# Patient Record
Sex: Male | Born: 1955 | Race: Black or African American | Hispanic: No | Marital: Married | Smoking: Never smoker
Health system: Southern US, Community
[De-identification: ages and names within clinical notes are randomized; demographics above are authoritative.]

## PROBLEM LIST (undated history)

## (undated) DIAGNOSIS — I10 Essential (primary) hypertension: Secondary | ICD-10-CM

## (undated) DIAGNOSIS — N200 Calculus of kidney: Secondary | ICD-10-CM

## (undated) DIAGNOSIS — T8859XA Other complications of anesthesia, initial encounter: Secondary | ICD-10-CM

## (undated) HISTORY — PX: KNEE ARTHROSCOPY: SUR90

## (undated) HISTORY — PX: OTHER SURGICAL HISTORY: SHX169

---

## 2001-06-21 ENCOUNTER — Encounter: Payer: Self-pay | Admitting: Emergency Medicine

## 2001-06-21 ENCOUNTER — Emergency Department (HOSPITAL_COMMUNITY): Admission: EM | Admit: 2001-06-21 | Discharge: 2001-06-21 | Payer: Self-pay | Admitting: Emergency Medicine

## 2001-07-12 ENCOUNTER — Emergency Department (HOSPITAL_COMMUNITY): Admission: EM | Admit: 2001-07-12 | Discharge: 2001-07-12 | Payer: Self-pay | Admitting: Emergency Medicine

## 2001-07-12 ENCOUNTER — Encounter: Payer: Self-pay | Admitting: Emergency Medicine

## 2001-10-31 ENCOUNTER — Emergency Department (HOSPITAL_COMMUNITY): Admission: EM | Admit: 2001-10-31 | Discharge: 2001-10-31 | Payer: Self-pay | Admitting: Internal Medicine

## 2001-10-31 ENCOUNTER — Encounter: Payer: Self-pay | Admitting: Internal Medicine

## 2002-07-12 ENCOUNTER — Encounter: Payer: Self-pay | Admitting: Emergency Medicine

## 2002-07-12 ENCOUNTER — Emergency Department (HOSPITAL_COMMUNITY): Admission: EM | Admit: 2002-07-12 | Discharge: 2002-07-12 | Payer: Self-pay | Admitting: Emergency Medicine

## 2003-10-23 ENCOUNTER — Ambulatory Visit (HOSPITAL_COMMUNITY): Admission: RE | Admit: 2003-10-23 | Discharge: 2003-10-23 | Payer: Self-pay | Admitting: Family Medicine

## 2006-04-28 ENCOUNTER — Inpatient Hospital Stay (HOSPITAL_COMMUNITY): Admission: EM | Admit: 2006-04-28 | Discharge: 2006-05-09 | Payer: Self-pay | Admitting: Emergency Medicine

## 2006-09-15 ENCOUNTER — Emergency Department (HOSPITAL_COMMUNITY): Admission: EM | Admit: 2006-09-15 | Discharge: 2006-09-16 | Payer: Self-pay | Admitting: Emergency Medicine

## 2006-09-17 ENCOUNTER — Ambulatory Visit (HOSPITAL_COMMUNITY): Admission: RE | Admit: 2006-09-17 | Discharge: 2006-09-17 | Payer: Self-pay | Admitting: Internal Medicine

## 2006-10-07 ENCOUNTER — Ambulatory Visit (HOSPITAL_COMMUNITY): Admission: RE | Admit: 2006-10-07 | Discharge: 2006-10-07 | Payer: Self-pay | Admitting: Pulmonary Disease

## 2007-08-08 IMAGING — NM NM PULM PERFUSION & VENT (REBREATHING & WASHOUT)
10 series · 20 of 20 positions shown · non-contrast
Comparison: Chest x-ray 05/04/2006

CLINICAL DATA: Pneumonia, DVT, assess for pulmonary embolus.

NM VENTILATION - PERFUSION LUNG SCAN
TECHNIQUE: Wash-in, equilibrium, and wash-out phase ventilation images were
obtained using 0e-922 gas.  Perfusion images were obtained in multiple
projections after intravenous injection of Oc-CCm MAA.
Radiopharmaceutical:  20 mCi 0e-922 gas and 5.5 mCi Oc-CCm MAA

[Series 1: lung scan · 6.39mm/px · 6 of 16 frames shown (1 of 10)]
[frame 2/16]
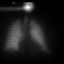
[frame 4/16]
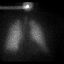
[frame 7/16]
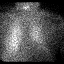
[frame 10/16]
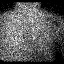
[frame 12/16]
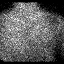
[frame 15/16]
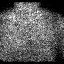

[Series 1: lung scan · 1.60mm/px · 1 of 1 slices shown (2 of 10)]
[im 1/1]
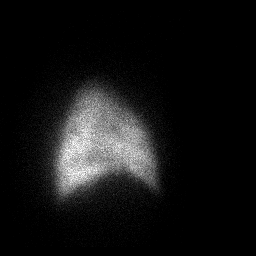

[Series 1: lung scan · 1.60mm/px · 1 of 1 slices shown (3 of 10)]
[im 1/1]
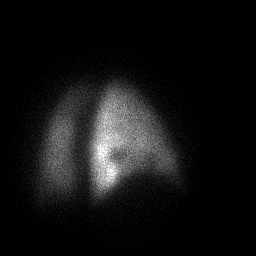

[Series 1: lung scan · 1.60mm/px · 1 of 1 slices shown (4 of 10)]
[im 1/1]
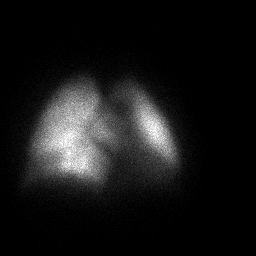

[Series 1: lung scan · 1.60mm/px · 1 of 1 slices shown (5 of 10)]
[im 1/1]
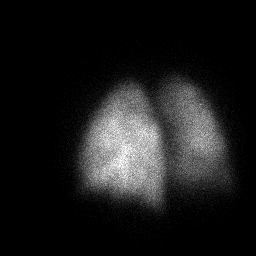

[Series 1: lung scan · 1.60mm/px · 1 of 1 slices shown (6 of 10)]
[im 1/1]
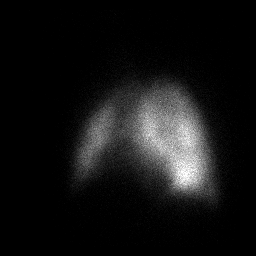

[Series 1: lung scan · 1.60mm/px · 1 of 1 slices shown (7 of 10)]
[im 1/1]
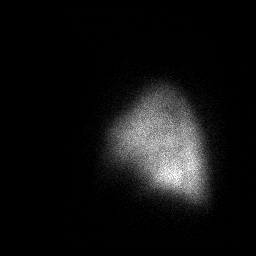

[Series 1: lung scan · 6.39mm/px · 6 of 16 frames shown (8 of 10)]
[frame 2/16]
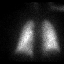
[frame 4/16]
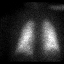
[frame 7/16]
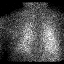
[frame 10/16]
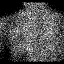
[frame 12/16]
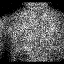
[frame 15/16]
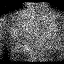

[Series 1: lung scan · 1.60mm/px · 1 of 1 slices shown (9 of 10)]
[im 1/1]
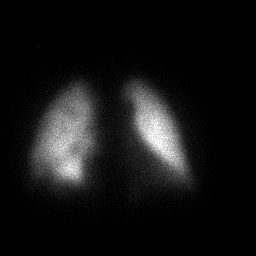

[Series 1: lung scan · 1.60mm/px · 1 of 1 slices shown (10 of 10)]
[im 1/1]
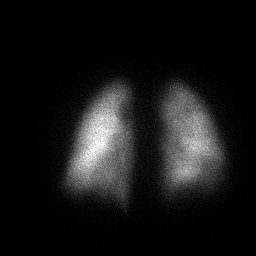

[20 of 20 positions shown; findings below may reference images not displayed]

FINDINGS: Ventilation portion of the study is normal. On the perfusion portion,
there is a persistent defect noted laterally at the right lung base. This is
nearly a segmental defect. No additional defects seen.

IMPRESSION

Single segmental mismatch seen peripherally in the right lower lobe. This is an
indeterminate/intermediate finding for pulmonary embolus.

## 2007-08-10 IMAGING — NM NM MYOCAR PERF WALL MOTION
1 series · 6 of 6 positions shown · non-contrast
Comparison: none

CLINICAL DATA: Hypertension, abnormal EKG

[Series 1: cs cardiac tc hi dose · 6.52mm/px · 6 of 512 frames shown]
[frame 43/512]
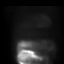
[frame 128/512]
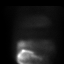
[frame 214/512]
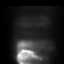
[frame 299/512]
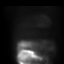
[frame 384/512]
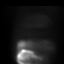
[frame 470/512]
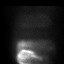

[6 of 6 positions shown; findings below may reference images not displayed]

MYOCARDIAL PERFUSION SPECT IMAGING (RESTING AND GATED PERSANTINE  STRESS):
WALL MOTION EVALUATION:
EJECTION FRACTION CALCULATION:

Single day injection protocol utilizing 10 + 30 mCi XcGGm Myoview IV.  No  
previous available for comparison.  The stress SPECT images demonstrate
physiologic distribution of radiopharmaceutical. Rest images demonstrate no
perfusion defects. The gated stress SPECT images demonstrate normal left
ventricular myocardial thickening.  No focal wall motion abnormality is seen.
Calculated left ventricular end-diastolic volume 191 ml, end-systolic volume 84
ml, ejection fraction of 56 %.
IMPRESSION: 1. Negative for pharmacologic-stress induced ischemia.

2. Left ventricular ejection fraction 56 %

## 2007-08-11 IMAGING — RF DG RETROGRADE PYELOGRAM
1 series · 9 of 9 positions shown · non-contrast
Comparison: none

CLINICAL DATA: Right kidney stone.
 RIGHT RETROGRADE PYELOGRAM:

[Series 1: run · 3 acquisitions, 9 frames shown]
[im 1/3]
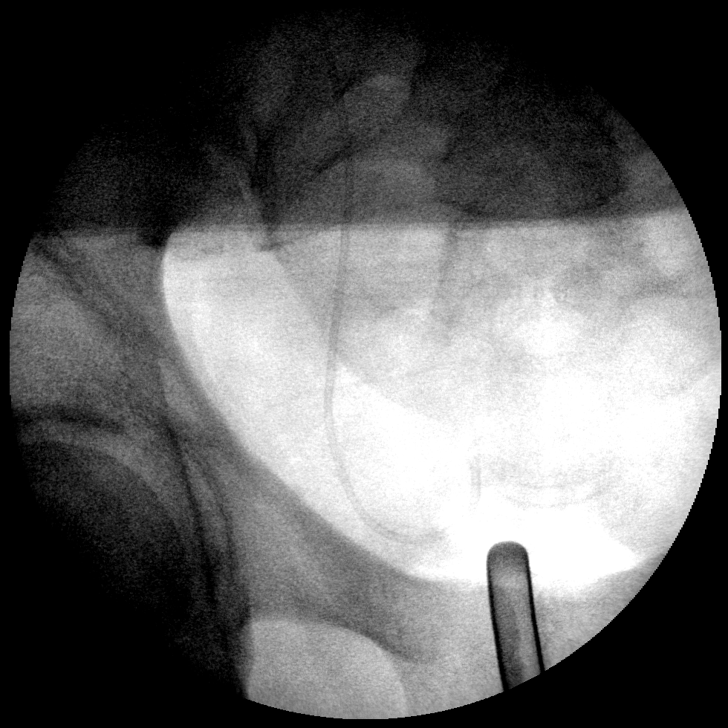
[im 1/3]
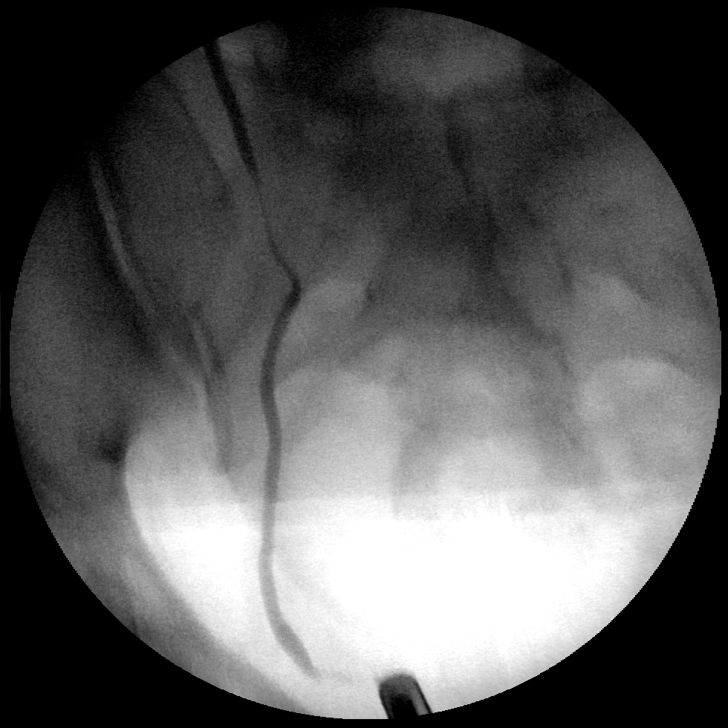
[im 1/3]
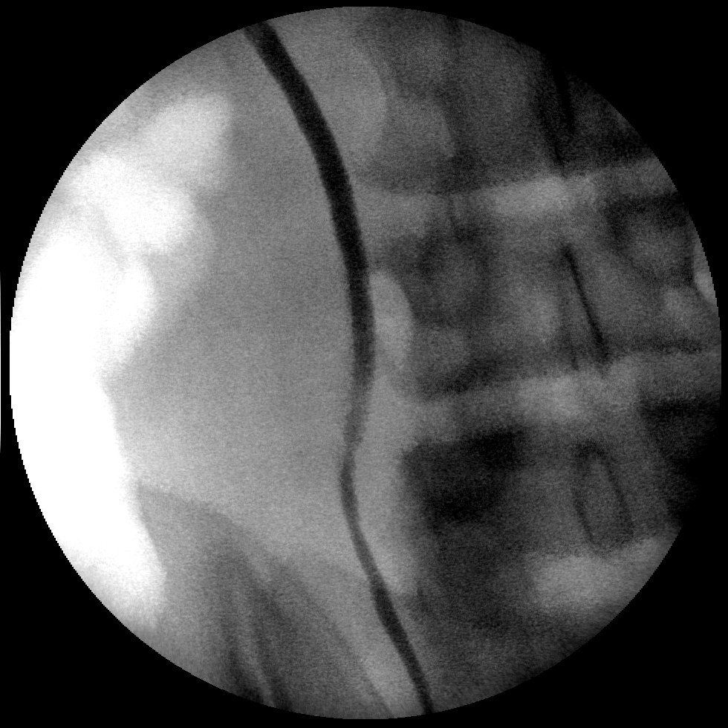
[im 1/3]
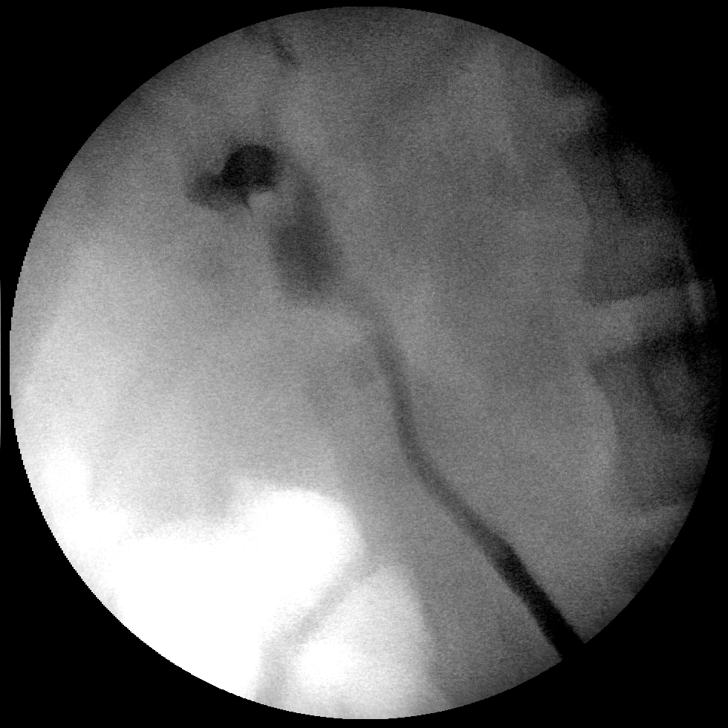
[im 2/3]
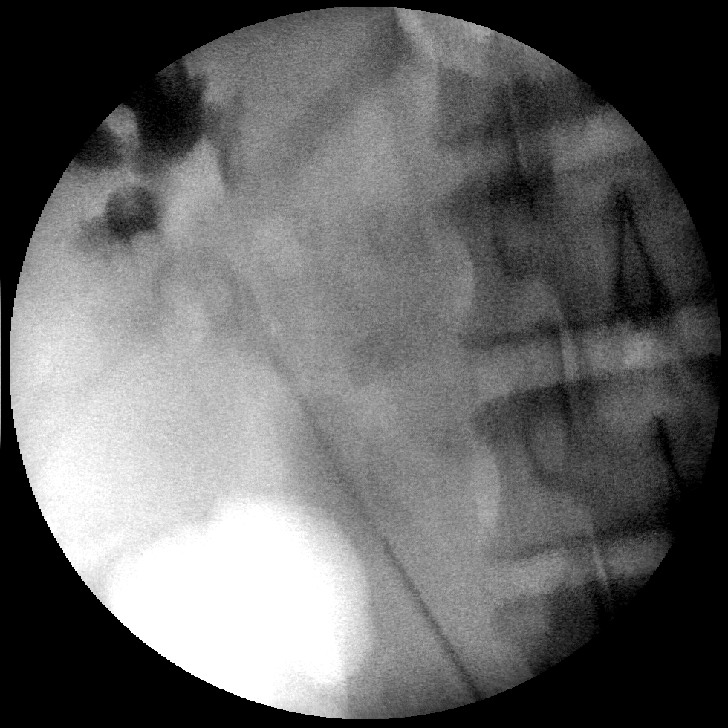
[im 2/3]
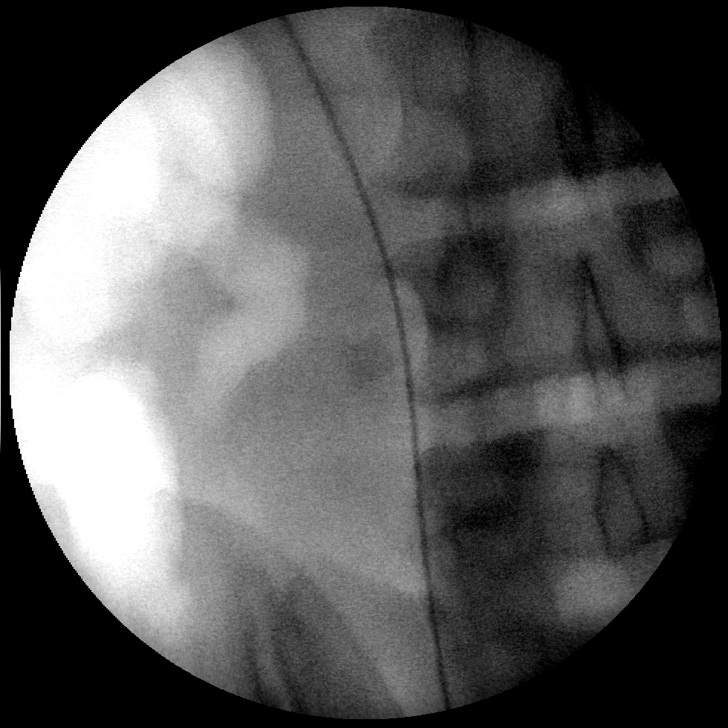
[im 2/3]
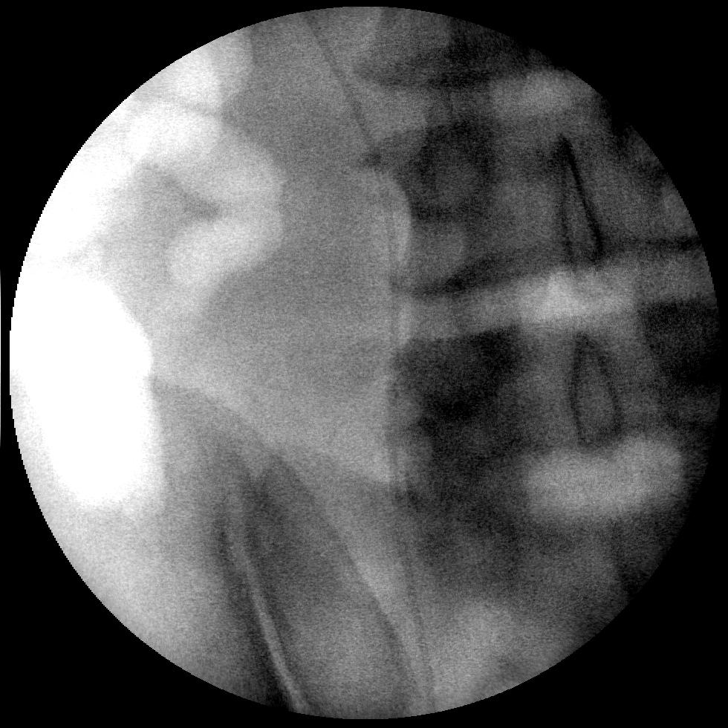
[im 2/3]
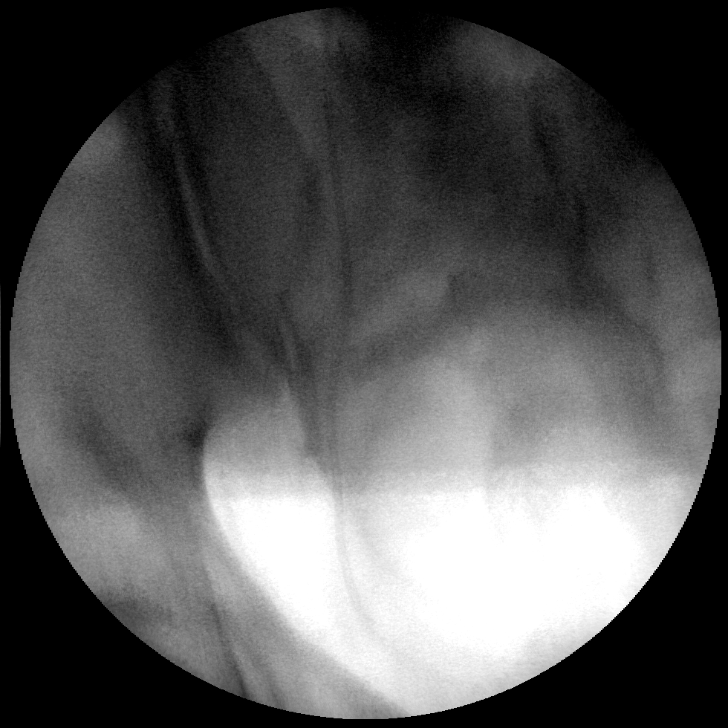
[im 3/3]
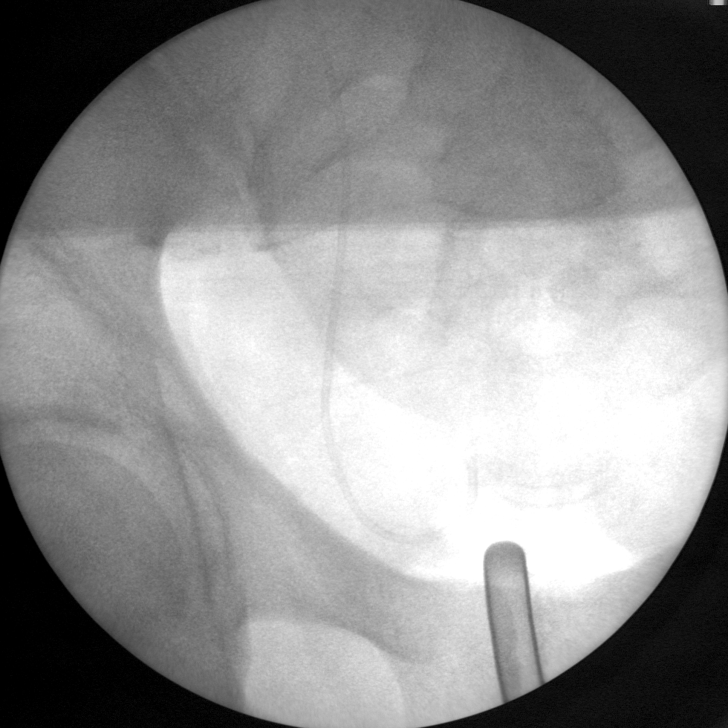

[9 of 9 positions shown; findings below may reference images not displayed]

FINDINGS: Contrast is injected in the right ureteral orifice.  The right ureter is not opacified.  There is mild dilatation of the proximal ureter.  There is a round filling defect in the mid right ureter, which may represent a stone or an air bubble.  A stone was present in this area on a KUB from 05/02/06.
 A right ureteral stent was placed.
IMPRESSION: Stone in the proximal to mid right ureter.  Right ureteral stent was placed.

## 2009-08-31 ENCOUNTER — Emergency Department (HOSPITAL_COMMUNITY): Admission: EM | Admit: 2009-08-31 | Discharge: 2009-08-31 | Payer: Self-pay | Admitting: Emergency Medicine

## 2010-05-08 ENCOUNTER — Emergency Department (HOSPITAL_COMMUNITY): Admission: EM | Admit: 2010-05-08 | Discharge: 2010-05-08 | Payer: Self-pay | Admitting: Emergency Medicine

## 2010-05-09 ENCOUNTER — Ambulatory Visit (HOSPITAL_COMMUNITY): Admission: RE | Admit: 2010-05-09 | Discharge: 2010-05-09 | Payer: Self-pay | Admitting: Urology

## 2010-08-03 ENCOUNTER — Encounter: Payer: Self-pay | Admitting: *Deleted

## 2010-09-25 LAB — URINE MICROSCOPIC-ADD ON

## 2010-09-25 LAB — URINALYSIS, ROUTINE W REFLEX MICROSCOPIC
Bilirubin Urine: NEGATIVE
Nitrite: NEGATIVE
Specific Gravity, Urine: 1.025 (ref 1.005–1.030)
Urobilinogen, UA: 1 mg/dL (ref 0.0–1.0)
pH: 6 (ref 5.0–8.0)

## 2010-09-25 LAB — DIFFERENTIAL
Eosinophils Relative: 0 % (ref 0–5)
Lymphocytes Relative: 18 % (ref 12–46)
Lymphs Abs: 1.5 10*3/uL (ref 0.7–4.0)
Monocytes Absolute: 0.4 10*3/uL (ref 0.1–1.0)
Monocytes Relative: 5 % (ref 3–12)

## 2010-09-25 LAB — BASIC METABOLIC PANEL
CO2: 28 mEq/L (ref 19–32)
Calcium: 9.1 mg/dL (ref 8.4–10.5)
Creatinine, Ser: 1.34 mg/dL (ref 0.4–1.5)
GFR calc Af Amer: >60 mL/min (ref >60)
Sodium: 138 mEq/L (ref 135–145)

## 2010-09-25 LAB — CBC
HCT: 40.6 % (ref 39.0–52.0)
Hemoglobin: 13.8 g/dL (ref 13.0–17.0)
MCV: 96.3 fL (ref 78.0–100.0)
Platelets: 233 10*3/uL (ref 150–400)
RBC: 4.22 MIL/uL (ref 4.22–5.81)
WBC: 8.5 10*3/uL (ref 4.0–10.5)

## 2010-10-08 ENCOUNTER — Emergency Department (HOSPITAL_COMMUNITY)
Admission: EM | Admit: 2010-10-08 | Discharge: 2010-10-08 | Disposition: A | Discharge status: Home / Self Care | Payer: BC Managed Care – PPO | Attending: Emergency Medicine | Admitting: Emergency Medicine

## 2010-10-08 ENCOUNTER — Emergency Department (HOSPITAL_COMMUNITY): Payer: BC Managed Care – PPO

## 2010-10-08 DIAGNOSIS — R109 Unspecified abdominal pain: Secondary | ICD-10-CM | POA: Insufficient documentation

## 2010-10-08 DIAGNOSIS — R110 Nausea: Secondary | ICD-10-CM | POA: Insufficient documentation

## 2010-10-08 DIAGNOSIS — N200 Calculus of kidney: Secondary | ICD-10-CM | POA: Insufficient documentation

## 2010-10-08 LAB — URINALYSIS, ROUTINE W REFLEX MICROSCOPIC
Glucose, UA: NEGATIVE mg/dL
Ketones, ur: NEGATIVE mg/dL
Leukocytes, UA: NEGATIVE
pH: 6 (ref 5.0–8.0)

## 2010-10-08 LAB — DIFFERENTIAL
Basophils Absolute: 0.1 10*3/uL (ref 0.0–0.1)
Lymphocytes Relative: 32 % (ref 12–46)
Neutro Abs: 3.6 10*3/uL (ref 1.7–7.7)

## 2010-10-08 LAB — BASIC METABOLIC PANEL
CO2: 29 mEq/L (ref 19–32)
Calcium: 8.7 mg/dL (ref 8.4–10.5)
GFR calc Af Amer: >60 mL/min (ref >60)
GFR calc non Af Amer: >60 mL/min (ref >60)
Sodium: 135 mEq/L (ref 135–145)

## 2010-10-08 LAB — CBC
HCT: 38.4 % — ABNORMAL LOW (ref 39.0–52.0)
Hemoglobin: 12.9 g/dL — ABNORMAL LOW (ref 13.0–17.0)
WBC: 6.4 10*3/uL (ref 4.0–10.5)

## 2010-11-29 NOTE — Discharge Summary (Signed)
[name], [facility]             ACCOUNT NO.:  [id number]   MEDICAL RECORD NO.:  [id number]          PATIENT TYPE:  INP   LOCATION:  A210                          FACILITY:  [facility]   PHYSICIAN:  Patrica Duel, M.D.    DATE OF BIRTH:  1955/11/03   DATE OF ADMISSION:  04/28/2006  DATE OF DISCHARGE:  10/27/2007LH                               DISCHARGE SUMMARY   DISCHARGE DIAGNOSIS:  1. Right ureteral stone, status post stent.  2. Documented superficial thrombophlebitis of the lower extremity with      an intermediate VQ scan, suspect pulmonary embolism.  3. EKG changes of questionable significance.  Cardiolite/echo benign.  4. Question bibasilar pneumonia symptoms resolved.  5. Mild renal insufficiency.  6. History of hypertension.   HISTORY OF PRESENT ILLNESS:  For details regarding admission please  refer to the admitting note. Briefly, this 55 year old male presented  with abdominal pain.  Admitted with an obstructing stone.  We were  consulted.  He had experienced some fever the day prior to admission and  he was treated empirically as pneumonia was apparent on x-ray.  He had  no significant symptoms of cough or shortness of breath or chest pain.   The patient did well and preoperatively had EKG which revealed  significant ST changes, which appeared to be LVH with strain, however  ischemia could not be excluded.  His urologic procedure was postponed  due to these EKG changes.  A cardiology consult was obtained.  A  Cardiolite was benign.  In interim his D-dimer was elevated and his VQ  scan was obtained which was intermediate with the conclusion being an  intermediate probability of PE.  He had Doppler of his lower extremity  which revealed a small thrombus within one branch of the gastroc vein  located in the calf (left).   The patient remained remarkably stable with little pain.  Did have a  slight bump in creatinine to 1.7.  Prior to discharge the patient  underwent  stenting of the right ureter performed by Ky Barban,  M.D.  This was uncomplicated.  The patient was discharged after the  procedure on Ziac 2.5 daily and Coumadin 5 mg daily.   The patient is stable and much improved and will be followed and treated  as outpatient.      Patrica Duel, M.D.  Electronically Signed     MC/MEDQ  D:  06/14/2006  T:  06/15/2006  Job:  [id number]

## 2010-11-29 NOTE — Procedures (Signed)
NAMEVINCENZO, STAVE             ACCOUNT NO.:  [id number]   MEDICAL RECORD NO.:  [id number]          PATIENT TYPE:  OUT   LOCATION:  RESP                          FACILITY:  [facility]   PHYSICIAN:  Edward L. Juanetta Gosling, M.D.DATE OF BIRTH:  January 05, 1956   DATE OF PROCEDURE:  DATE OF DISCHARGE:                            PULMONARY FUNCTION TEST   RESULTS:  1. Spirometry shows no definite ventilatory defect.  There is no air      flow obstruction.  2. Lung volumes are normal.  3. DLCO is normal.  4. Arterial blood gases are normal.      Edward L. Juanetta Gosling, M.D.  Electronically Signed     ELH/MEDQ  D:  10/07/2006  T:  10/07/2006  Job:  [id number]

## 2010-11-29 NOTE — Op Note (Signed)
[facility], [facility]             ACCOUNT NO.:  [id number]   MEDICAL RECORD NO.:  [id number]          PATIENT TYPE:  INP   LOCATION:  A210                          FACILITY:  [facility]   PHYSICIAN:  Ky Barban, M.D.DATE OF BIRTH:  1956-05-05   DATE OF PROCEDURE:  05/07/2006  DATE OF DISCHARGE:                                 OPERATIVE REPORT   PREOPERATIVE DIAGNOSIS:  Right ureteral calculus.   POSTOPERATIVE DIAGNOSIS:  Right ureteral calculus.   PROCEDURES:  Cystoscopy, right retrograde pyelogram, insertion of double-J  stent size 5-French 24 cm, no string attached.   ANESTHESIA:  General endotracheal.   PROCEDURE:  The patient under general endotracheal anesthesia in lithotomy  position, usual prep and drape, a #25 cystoscope was introduced into the  bladder.  It was inspected, looks normal, right ureteral orifice  catheterized with a wedge catheter Hypaque injected under fluoroscopic  control.  Ureter above the level of L3 is dilated.  I do not see any  definite filling defect.  Anyway, after this, a guidewire was passed up into  the renal pelvis.  Over the guide wire 5-French 24 cm double-J stent is  positioned, nice loop in the renal pelvis and the bladder is obtained.  Guidewire and all the other instruments are removed.  The patient left the  operating room in satisfactory condition.      Ky Barban, M.D.  Electronically Signed     MIJ/MEDQ  D:  05/07/2006  T:  05/08/2006  Job:  [id number]

## 2010-11-29 NOTE — H&P (Signed)
[facility], [facility]             ACCOUNT NO.:  [id number]   MEDICAL RECORD NO.:  [id number]          PATIENT TYPE:  INP   LOCATION:  A336                          FACILITY:  [facility]   PHYSICIAN:  Patrica Duel, M.D.    DATE OF BIRTH:  16-May-1956   DATE OF ADMISSION:  04/28/2006  DATE OF DISCHARGE:  LH                                HISTORY & PHYSICAL   CHIEF COMPLAINTS:  Abdominal pain, fevers.   HISTORY OF PRESENT ILLNESS:  This is a 55 year old male with a history of  nephro-ureterolithiasis and hypertension.  His history is otherwise benign.   The patient presented to the emergency department with a 4-5 hour history of  severe flank and right lower quadrant colicky pain.  He had also complained  of the abrupt onset of chills and fever.  He denied cough, chest pain,  shortness of breath, syncope, palpitations, headache, neurologic deficits,  nausea, vomiting, diarrhea, melena, hematemesis, hematochezia or significant  genitourinary symptoms.   In the emergency department the patient was found to have a right ureteral  stone as well as incidentally noted on CT bibasilar lung opacities  compatible with a airspace disease most consistent with pneumonia.   The patient was admitted with ureterolithiasis which is recurrent as well as  pneumonia on x-ray but clinically not apparent.   CURRENT MEDICATIONS:  Ziac 2.5 mg daily.   ALLERGIES:  NONE KNOWN.   PAST HISTORY:  Noted above.   FAMILY HISTORY:  Noncontributory.   REVIEW OF SYSTEMS:  Negative except as mentioned.   SOCIAL HISTORY:  The patient is a Optician, dispensing.  Nonsmoker, nondrinker.  Compliant with medications.   PHYSICAL EXAMINATION:  A very pleasant white male who is alert and oriented  in no acute distress.  VITAL SIGNS: At presentation temperature 101.1, BP 140/76, pulse 64 regular,  respirations 16, O2 sat 97%.  HEENT:  Normocephalic, atraumatic.  Pupils are equal.  Ears, nose are  benign.  NECK:  Supple with no  bruits, thyromegaly, masses, lymphadenopathy.  LUNGS:  Basically clear to A&P.  HEART:  Sounds are normal without murmurs, rubs or gallops.  ABDOMEN:  Nontender, nondistended  Bowel sounds are intact.  EXTREMITIES:  No clubbing, cyanosis or edema.  NEUROLOGIC:  Exam is normal limits.   OTHER PERTINENT LAB:  Includes a white count is 17,000 with left shift, H&H  13.4, hematocrit 38.1.  Electrolytes are normal.  Glucose mildly elevated at  128.  Urinalysis 3-6 red cells, otherwise benign.   ASSESSMENT:  Ureteral stone (dimensions not available) with renal colic.  Subclinical pneumonia which apparently is a coincidental finding at this  time.   PLAN:  Admit for broad spectrum antibiotics, pain control and aggressive  hydration.  Urology consult.  Will follow and treat expectantly.      Patrica Duel, M.D.  Electronically Signed     MC/MEDQ  D:  04/28/2006  T:  04/28/2006  Job:  [id number]

## 2010-11-29 NOTE — Procedures (Signed)
[facility], [facility] Phillips             ACCOUNT NO.:  [id number]   MEDICAL RECORD NO.:  [id number]          PATIENT TYPE:  INP   LOCATION:  A210                          FACILITY:  [facility]   PHYSICIAN:  Dani Gobble, MD       DATE OF BIRTH:  11/02/1955   DATE OF PROCEDURE:  05/06/2006  DATE OF DISCHARGE:                                    STRESS TEST   REFERRING PHYSICIAN:  Dr. Nobie Putnam and Dr. Domingo Sep.   INDICATIONS:  Mr. Holberg is a very pleasant 55 year old gentleman who was  admitted with kidney stones and was found to have an abnormal EKG with  changes as compared to several years ago. He is referred for Persantine  Myoview to assess for ischemia.   ELECTROCARDIOGRAPHIC/HEMODYNAMIC DATA:  Baseline blood pressure 144/82 mmHg  with a pulse of 59 beats per minute. Baseline 12-lead EKG reveled sinus  rhythm/sinus bradycardia with nonspecific ST changes. Persantine was infused  per protocol. The patient experienced some mild throat discomfort which  resolved by test end. There were no additional EKG changes noted during or  after the infusion. Peak blood pressure was 150 systolic and returned to  baseline at 132/70 mmHg.   IMPRESSION:  1. EKG negative for ischemia.  2. Scintigraphic images are pending.           ______________________________  Dani Gobble, MD     AB/MEDQ  D:  05/06/2006  T:  05/07/2006  Job:  [id number]

## 2010-11-29 NOTE — Procedures (Signed)
[facility], [facility] Phillips             ACCOUNT NO.:  [id number]   MEDICAL RECORD NO.:  [id number]          PATIENT TYPE:  INP   LOCATION:  A210                          FACILITY:  [facility]   PHYSICIAN:  Dani Gobble, MD       DATE OF BIRTH:  [birth year]   DATE OF PROCEDURE:  DATE OF DISCHARGE:                                  ECHOCARDIOGRAM   REFERRING PHYSICIAN:  Patrica Duel, M.D.   INDICATIONS:  A 55 year old gentleman with a prior history of hypertension  who has been experiencing some shortness of breath and is referred for LV  function.   The technical quality of the study is reasonable.   The aorta measures normally at 3.5 cm.   The left atrium appears mildly dilated, measured at 4.3 cm.   The interventricular septum and posterior wall are mildly thickened.   The aortic valve is trileaflet with normal leaflet opening.  No aortic  insufficiency is noted.  Doppler interrogation of the aortic valve is within  normal limits.   The mitral valve appears structurally normal with mild thickening but  without limitation of leaflet excursion.  No mitral valve prolapse is noted.  Trivial mitral regurgitation is noted.  Doppler interrogation of the mitral  valve is within normal limits.   The pulmonic valve appears grossly structurally normal.   The tricuspid valve also appears structurally normal with mild tricuspid  regurgitation noted.   The left ventricle appears to be at the upper limits of normal in size to  borderline dilated.  Overall left ventricular systolic function appears to  be at the lower limits of normal with an ejection fraction estimated at 50-  55%.  In some views, the anteroseptal wall appears mildly qualitatively  hypokinetic as compared to the remaining walls; however, this is not  consistently appreciated in all views.  There is no suggestion of diastolic  dysfunction on this study.   The right atrium and right ventricle appear normal in size and right  ventricular systolic function, although not well seen, appears to be grossly  normal.   The IVC is normal in size with normal collapse.   IMPRESSION:  1. Mild left atrial enlargement.  2. Mild concentric left ventricular hypertrophy.  3. Trivial mitral and mild tricuspid regurgitation.  4. Borderline enlargement to mild dilatation of the left ventricle with      overall ejection fraction estimated at the lower limits of normal (50-      55%).  In some views the anteroseptal wall appears mildly qualitatively      hypokinetic as compared to the remaining walls, but this is not      consistently appreciated in all views.           ______________________________  Dani Gobble, MD     AB/MEDQ  D:  05/05/2006  T:  05/06/2006  Job:  [id number]   cc:   Patrica Duel, M.D.  Fax: [phone]

## 2011-11-07 ENCOUNTER — Other Ambulatory Visit (HOSPITAL_COMMUNITY): Payer: Self-pay | Admitting: Physician Assistant

## 2011-11-07 DIAGNOSIS — R109 Unspecified abdominal pain: Secondary | ICD-10-CM

## 2011-11-10 ENCOUNTER — Other Ambulatory Visit (HOSPITAL_COMMUNITY): Payer: BC Managed Care – PPO

## 2011-11-13 ENCOUNTER — Ambulatory Visit (HOSPITAL_COMMUNITY)
Admission: RE | Admit: 2011-11-13 | Discharge: 2011-11-13 | Disposition: A | Discharge status: Home / Self Care | Payer: BC Managed Care – PPO | Source: Ambulatory Visit | Attending: Physician Assistant | Admitting: Physician Assistant

## 2011-11-13 DIAGNOSIS — R109 Unspecified abdominal pain: Secondary | ICD-10-CM

## 2011-11-13 DIAGNOSIS — N200 Calculus of kidney: Secondary | ICD-10-CM | POA: Insufficient documentation

## 2011-11-13 DIAGNOSIS — R1011 Right upper quadrant pain: Secondary | ICD-10-CM | POA: Insufficient documentation

## 2017-06-11 ENCOUNTER — Encounter (HOSPITAL_COMMUNITY): Payer: Self-pay | Admitting: Emergency Medicine

## 2017-06-11 ENCOUNTER — Emergency Department (HOSPITAL_COMMUNITY)
Admission: EM | Admit: 2017-06-11 | Discharge: 2017-06-11 | Disposition: A | Discharge status: Home / Self Care | Payer: BLUE CROSS/BLUE SHIELD | Attending: Emergency Medicine | Admitting: Emergency Medicine

## 2017-06-11 ENCOUNTER — Other Ambulatory Visit: Payer: Self-pay

## 2017-06-11 ENCOUNTER — Emergency Department (HOSPITAL_COMMUNITY): Payer: BLUE CROSS/BLUE SHIELD

## 2017-06-11 DIAGNOSIS — Z79899 Other long term (current) drug therapy: Secondary | ICD-10-CM | POA: Diagnosis not present

## 2017-06-11 DIAGNOSIS — Z7982 Long term (current) use of aspirin: Secondary | ICD-10-CM | POA: Diagnosis not present

## 2017-06-11 DIAGNOSIS — N201 Calculus of ureter: Secondary | ICD-10-CM | POA: Insufficient documentation

## 2017-06-11 DIAGNOSIS — R1032 Left lower quadrant pain: Secondary | ICD-10-CM | POA: Diagnosis present

## 2017-06-11 HISTORY — DX: Calculus of kidney: N20.0

## 2017-06-11 LAB — COMPREHENSIVE METABOLIC PANEL
ALK PHOS: 78 U/L (ref 38–126)
ALT: 17 U/L (ref 17–63)
AST: 21 U/L (ref 15–41)
Albumin: 3.7 g/dL (ref 3.5–5.0)
Anion gap: 6 (ref 5–15)
BUN: 14 mg/dL (ref 6–20)
CALCIUM: 8.7 mg/dL — AB (ref 8.9–10.3)
CO2: 29 mmol/L (ref 22–32)
CREATININE: 1.19 mg/dL (ref 0.61–1.24)
Chloride: 102 mmol/L (ref 101–111)
GFR calc non Af Amer: >60 mL/min (ref >60)
GLUCOSE: 114 mg/dL — AB (ref 65–99)
Potassium: 3.7 mmol/L (ref 3.5–5.1)
SODIUM: 137 mmol/L (ref 135–145)
Total Bilirubin: 0.4 mg/dL (ref 0.3–1.2)
Total Protein: 7 g/dL (ref 6.5–8.1)

## 2017-06-11 LAB — CBC WITH DIFFERENTIAL/PLATELET
Basophils Absolute: 0.1 10*3/uL (ref 0.0–0.1)
Basophils Relative: 1 %
Eosinophils Absolute: 0.1 10*3/uL (ref 0.0–0.7)
Eosinophils Relative: 1 %
HEMATOCRIT: 40.9 % (ref 39.0–52.0)
Hemoglobin: 13.6 g/dL (ref 13.0–17.0)
LYMPHS ABS: 2.1 10*3/uL (ref 0.7–4.0)
Lymphocytes Relative: 37 %
MCH: 31.9 pg (ref 26.0–34.0)
MCHC: 33.3 g/dL (ref 30.0–36.0)
MCV: 96 fL (ref 78.0–100.0)
MONO ABS: 0.4 10*3/uL (ref 0.1–1.0)
MONOS PCT: 7 %
Neutro Abs: 3.1 10*3/uL (ref 1.7–7.7)
Neutrophils Relative %: 54 %
Platelets: 244 10*3/uL (ref 150–400)
RBC: 4.26 MIL/uL (ref 4.22–5.81)
RDW: 13.8 % (ref 11.5–15.5)
WBC: 5.7 10*3/uL (ref 4.0–10.5)

## 2017-06-11 LAB — URINALYSIS, ROUTINE W REFLEX MICROSCOPIC
BACTERIA UA: NONE SEEN
Bilirubin Urine: NEGATIVE
Glucose, UA: NEGATIVE mg/dL
KETONES UR: NEGATIVE mg/dL
LEUKOCYTES UA: NEGATIVE
Nitrite: NEGATIVE
PH: 6 (ref 5.0–8.0)
Protein, ur: NEGATIVE mg/dL
SPECIFIC GRAVITY, URINE: 1.016 (ref 1.005–1.030)
SQUAMOUS EPITHELIAL / LPF: NONE SEEN

## 2017-06-11 LAB — LIPASE, BLOOD: Lipase: 38 U/L (ref 11–51)

## 2017-06-11 MED ORDER — HYDROMORPHONE HCL 1 MG/ML IJ SOLN
0.5000 mg | INTRAMUSCULAR | Status: AC | PRN
  Administered 2017-06-11 (×2): 0.5 mg via INTRAVENOUS
  Filled 2017-06-11 (×2): qty 1

## 2017-06-11 MED ORDER — SODIUM CHLORIDE 0.9 % IV SOLN
INTRAVENOUS | Status: DC
  Administered 2017-06-11: 09:00:00 via INTRAVENOUS

## 2017-06-11 MED ORDER — HYDROCODONE-ACETAMINOPHEN 5-325 MG PO TABS: 1.0000 | ORAL_TABLET | Freq: Four times a day (QID) | ORAL | 0 refills | Status: DC | PRN

## 2017-06-11 MED ORDER — ONDANSETRON HCL 4 MG/2ML IJ SOLN
4.0000 mg | Freq: Once | INTRAMUSCULAR | Status: AC
  Administered 2017-06-11: 4 mg via INTRAVENOUS
  Filled 2017-06-11: qty 2

## 2017-06-11 MED ORDER — IBUPROFEN 400 MG PO TABS: 400.0000 mg | ORAL_TABLET | Freq: Three times a day (TID) | ORAL | 0 refills | Status: DC | PRN

## 2017-06-11 MED ORDER — KETOROLAC TROMETHAMINE 30 MG/ML IJ SOLN
15.0000 mg | Freq: Once | INTRAMUSCULAR | Status: AC
  Administered 2017-06-11: 15 mg via INTRAVENOUS
  Filled 2017-06-11: qty 1

## 2017-06-11 MED ORDER — ONDANSETRON HCL 4 MG PO TABS: 4.0000 mg | ORAL_TABLET | Freq: Four times a day (QID) | ORAL | 0 refills | Status: DC

## 2017-06-11 NOTE — Discharge Instructions (Signed)
Take the medications as needed for pain, use a urine strainer to help determine if you pass the stone, follow-up with urologist as we discussed

## 2017-06-11 NOTE — ED Triage Notes (Signed)
Pt history of kidney stone. Pt states left side flank/abd pain started 230a with pain and slight nausea. Denies vomiting or trouble urinating.

## 2017-06-11 NOTE — ED Provider Notes (Signed)
[facility] Provider Note   CSN: [id number] Arrival date & time: 06/11/17  0730     History   Chief Complaint Chief Complaint  Patient presents with  . Flank Pain    HPI Tony [facility] is a 61 y.o. male.  HPI Patient presents to the emergency room with complaints of left flank pain that he feels is related to a kidney stone.  He has had them before and this feels similar.  Patient states he started having pain yesterday in the left flank and left lower quadrant of his abdomen.  Pain is constant.  Nothing seems to make it worse.  He has not tried taking any medications for this.  He denies any vomiting, diarrhea, fever or dysuria.  Patient does have a history of hypertension but did not take his medications this morning.  He is denying any chest pain or shortness of breath. Past Medical History:  Diagnosis Date  . Kidney stone     There are no active problems to display for this patient.   Past Surgical History:  Procedure Laterality Date  . KNEE ARTHROSCOPY         Home Medications    Prior to Admission medications   Medication Sig Start Date End Date Taking? Authorizing Provider  aspirin 81 MG chewable tablet Chew 81 mg by mouth daily.   Yes [provider]  bisoprolol-hydrochlorothiazide (ZIAC) 2.5-6.25 MG tablet Take 1 tablet by mouth daily. 06/05/17  Yes [provider]  Chlorpheniramine-DM (CORICIDIN HBP COUGH/COLD PO) Take 1 tablet by mouth daily as needed (cold).   Yes [provider]  sildenafil (VIAGRA) 100 MG tablet Take 1 tablet by mouth daily as needed for erectile dysfunction. 03/18/17  Yes [provider]  HYDROcodone-acetaminophen (NORCO/VICODIN) 5-325 MG tablet Take 1 tablet by mouth every 6 (six) hours as needed for severe pain. 06/11/17   Dorie Rank, MD  ibuprofen (ADVIL,MOTRIN) 400 MG tablet Take 1 tablet (400 mg total) by mouth every 8 (eight) hours as needed. 06/11/17   Dorie Rank, MD    ondansetron (ZOFRAN) 4 MG tablet Take 1 tablet (4 mg total) by mouth every 6 (six) hours. 06/11/17   Dorie Rank, MD    Family History History reviewed. No pertinent family history.  Social History Social History   Tobacco Use  . Smoking status: Never Smoker  . Smokeless tobacco: Never Used  Substance Use Topics  . Alcohol use: No    Frequency: Never  . Drug use: No     Allergies   Patient has no allergy information on record.   Review of Systems Review of Systems  All other systems reviewed and are negative.    Physical Exam Updated Vital Signs BP (!) 138/92 (BP Location: Right Arm)   Pulse 66   Temp 98.2 F (36.8 C) (Oral)   Resp 18   SpO2 98%   Physical Exam  Constitutional: He appears well-developed and well-nourished. No distress.  HENT:  Head: Normocephalic and atraumatic.  Right Ear: External ear normal.  Left Ear: External ear normal.  Eyes: Conjunctivae are normal. Right eye exhibits no discharge. Left eye exhibits no discharge. No scleral icterus.  Neck: Neck supple. No tracheal deviation present.  Cardiovascular: Normal rate, regular rhythm and intact distal pulses.  Pulmonary/Chest: Effort normal and breath sounds normal. No stridor. No respiratory distress. He has no wheezes. He has no rales.  Abdominal: Soft. Bowel sounds are normal. He exhibits no distension. There is no tenderness.  There is no rebound and no guarding. No hernia.  Musculoskeletal: He exhibits no edema or tenderness.  Neurological: He is alert. He has normal strength. No cranial nerve deficit (no facial droop, extraocular movements intact, no slurred speech) or sensory deficit. He exhibits normal muscle tone. He displays no seizure activity. Coordination normal.  Skin: Skin is warm and dry. No rash noted.  Psychiatric: He has a normal mood and affect.  Nursing note and vitals reviewed.    ED Treatments / Results  Labs (all labs ordered are listed, but only abnormal results are  displayed) Labs Reviewed  COMPREHENSIVE METABOLIC PANEL - Abnormal; Notable for the following components:      Result Value   Glucose, Bld 114 (*)    Calcium 8.7 (*)    All other components within normal limits  URINALYSIS, ROUTINE W REFLEX MICROSCOPIC - Abnormal; Notable for the following components:   Hgb urine dipstick SMALL (*)    All other components within normal limits  LIPASE, BLOOD  CBC WITH DIFFERENTIAL/PLATELET     Radiology Ct Renal Stone Study  Result Date: 06/11/2017 CLINICAL DATA:  Left flank pain since this morning. EXAM: CT ABDOMEN AND PELVIS WITHOUT CONTRAST TECHNIQUE: Multidetector CT imaging of the abdomen and pelvis was performed following the standard protocol without IV contrast. COMPARISON:  Multiple prior CT scans.  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 No acute pulmonary findings. Hepatobiliary: Stable right hepatic lobe cyst. No worrisome hepatic lesions or intrahepatic biliary dilatation. The gallbladder surgically absent. No common bile duct dilatation. Pancreas: No mass, inflammation or ductal dilatation. Spleen: Normal size.  No focal lesions. Adrenals/Urinary Tract: The adrenal glands are normal and stable. There are bilateral renal calculi. The largest calculus is in the lower pole region of the left kidney measures 7 mm. There is moderate left-sided hydroureteronephrosis down to an obstructing 4 mm calculus at the left UVJ. No right-sided ureteral calculi. Moderate left-sided perinephric fluid and interstitial changes consistent with a high-grade obstructive process. No worrisome renal or bladder lesions. Stomach/Bowel: The stomach, duodenum, small bowel and colon are grossly normal without oral contrast. No inflammatory changes, mass lesions or obstructive findings. The terminal ileum and appendix are normal. Vascular/Lymphatic: The aorta is normal in caliber. No atherosclerotic  calcifications. No mesenteric or retroperitoneal mass or adenopathy. Small scattered lymph nodes are noted. Reproductive: The prostate gland and seminal vesicles are unremarkable. Other: No pelvic mass or adenopathy. No free pelvic fluid collections. No inguinal mass or adenopathy. Small periumbilical abdominal wall hernia containing fat and a small bowel loop but no findings for incarceration or obstruction. Musculoskeletal: No significant bony findings. Age advanced degenerative changes involving the lumbar spine. Stable sclerotic bone lesion involving the left ilium, likely benign bone island. IMPRESSION: 1. 4 mm left UVJ calculus with high-grade obstructive findings. 2. Bilateral renal calculi. 3. No abdominal/pelvic mass or adenopathy. 4. Chronic basilar lung disease. Electronically Signed   By: Marijo Sanes M.D.   On: 06/11/2017 10:55    Procedures Procedures (including critical care time)  Medications Ordered in ED Medications  ondansetron (ZOFRAN) injection 4 mg (4 mg Intravenous Given 06/11/17 0812)  HYDROmorphone (DILAUDID) injection 0.5 mg (0.5 mg Intravenous Given 06/11/17 0847)  ketorolac (TORADOL) 30 MG/ML injection 15 mg (15 mg Intravenous Given 06/11/17 1045)     Initial Impression / Assessment and Plan / ED Course  I have reviewed the triage vital signs and the nursing notes.  Pertinent  labs & imaging results that were available during my care of the patient were reviewed by me and considered in my medical decision making (see chart for details).  Clinical Course as of Jun 13 812  Thu Jun 11, 2017  1031 Small hematuria noted.  Labs otherwise normal.   Will get a CT renal to assess for a kidney stone  [JK]  1038 Patient still having pain after initial hydromorphone dose.  Will give a dose of Toradol.  CT scan is pending  [JK]    Clinical Course User Index [JK] Dorie Rank, MD   Pt presented to the ED with complaints of flank pain,sx concerning for ureteral colic.  CT scan  confirmed a ureteral stone.  Sx improved with treatment in the ED.  Discussed outpatient treatment.  Dc home with pain meds, follow up with urology.  Final Clinical Impressions(s) / ED Diagnoses   Final diagnoses:  Ureterolithiasis    ED Discharge Orders        Ordered    ibuprofen (ADVIL,MOTRIN) 400 MG tablet  Every 8 hours PRN     06/11/17 1221    HYDROcodone-acetaminophen (NORCO/VICODIN) 5-325 MG tablet  Every 6 hours PRN     06/11/17 1221    ondansetron (ZOFRAN) 4 MG tablet  Every 6 hours     06/11/17 1221       Dorie Rank, MD 06/13/17 [phone]

## 2017-06-11 NOTE — ED Notes (Signed)
Patient transported to CT 

## 2018-04-28 ENCOUNTER — Ambulatory Visit: Payer: BLUE CROSS/BLUE SHIELD

## 2018-05-18 ENCOUNTER — Ambulatory Visit (INDEPENDENT_AMBULATORY_CARE_PROVIDER_SITE_OTHER): Payer: Self-pay

## 2018-05-18 DIAGNOSIS — Z1211 Encounter for screening for malignant neoplasm of colon: Secondary | ICD-10-CM

## 2018-05-18 MED ORDER — NA SULFATE-K SULFATE-MG SULF 17.5-3.13-1.6 GM/177ML PO SOLN: 1.0000 | ORAL | 0 refills | Status: DC

## 2018-05-18 NOTE — Patient Instructions (Addendum)
Tony Phillips  1956/01/12 MRN: [id number]     Procedure Date: 12/21/2018 Time to register: 9:30am Place to register: [facility] Procedure Time: 10:30am Scheduled provider: R. Garfield Cornea, MD    PREPARATION FOR COLONOSCOPY WITH SUPREP BOWEL PREP KIT  Note: Suprep Bowel Prep Kit is a split-dose (2day) regimen. Consumption of BOTH 6-ounce bottles is required for a complete prep.  Please notify us immediately if you are diabetic, take iron supplements, or if you are on Coumadin or any other blood thinners.  Please let us know if you have any changes in your health or medications prior to your procedure.                                                                                                                                                  2 DAYS BEFORE PROCEDURE:  DATE: 12/19/2018   DAY: Sunday Begin clear liquid diet AFTER your lunch meal. NO SOLID FOODS after this point.  1 DAY BEFORE PROCEDURE:  DATE: 12/20/2018  DAY: Monday Continue clear liquids the entire day - NO SOLID FOOD.   At 6:00pm: Complete steps 1 through 4 below, using ONE (1) 6-ounce bottle, before going to bed. Step 1:  Pour ONE (1) 6-ounce bottle of SUPREP liquid into the mixing container.  Step 2:  Add cool drinking water to the 16 ounce line on the container and mix.  Note: Dilute the solution concentrate as directed prior to use. Step 3:  DRINK ALL the liquid in the container. Step 4:  You MUST drink an additional two (2) or more 16 ounce containers of water over the next one (1) hour.   Continue clear liquids.  DAY OF PROCEDURE:   DATE: 12/21/2018   DAY: Tuesday If you take medications for your heart, blood pressure, or breathing, you may take these medications.   5 hours before your procedure at :5:30am Step 1:  Pour ONE (1) 6-ounce bottle of SUPREP liquid into the mixing container.  Step 2:  Add cool drinking water to the 16 ounce line on the container and mix.  Note: Dilute the solution  concentrate as directed prior to use. Step 3:  DRINK ALL the liquid in the container. Step 4:  You MUST drink an additional two (2) or more 16 ounce containers of water over the next one (1) hour. You MUST complete the final glass of water at least 3 hours before your colonoscopy.   Nothing by mouth past 7:30am  You may take your morning medications with sip of water unless we have instructed otherwise.    Please see below for Dietary Information.  CLEAR LIQUIDS INCLUDE:  Water Jello (NOT red in color)   Ice Popsicles (NOT red in color)   Tea (sugar ok, no milk/cream) Powdered fruit flavored drinks  Coffee (sugar ok, no milk/cream) Gatorade/  Lemonade/ Kool-Aid  (NOT red in color)   Juice: apple, white grape, white cranberry Soft drinks  Clear bullion, consomme, broth (fat free beef/chicken/vegetable)  Carbonated beverages (any kind)  Strained chicken noodle soup Hard Candy   Remember: Clear liquids are liquids that will allow you to see your fingers on the other side of a clear glass. Be sure liquids are NOT red in color, and not cloudy, but CLEAR.  DO NOT EAT OR DRINK ANY OF THE FOLLOWING:  Dairy products of any kind   Cranberry juice Tomato juice / V8 juice   Grapefruit juice Orange juice     Red grape juice  Do not eat any solid foods, including such foods as: cereal, oatmeal, yogurt, fruits, vegetables, creamed soups, eggs, bread, crackers, pureed foods in a blender, etc.   HELPFUL HINTS FOR DRINKING PREP SOLUTION:   Make sure prep is extremely cold. Mix and refrigerate the the morning of the prep. You may also put in the freezer.   You may try mixing some Crystal Light or Country Time Lemonade if you prefer. Mix in small amounts; add more if necessary.  Try drinking through a straw  Rinse mouth with water or a mouthwash between glasses, to remove after-taste.  Try sipping on a cold beverage /ice/ popsicles between glasses of prep.  Place a piece of sugar-free hard  candy in mouth between glasses.  If you become nauseated, try consuming smaller amounts, or stretch out the time between glasses. Stop for 30-60 minutes, then slowly start back drinking.     OTHER INSTRUCTIONS  You will need a responsible adult at least 61 years of age to accompany you and drive you home. This person must remain in the waiting room during your procedure. The hospital will cancel your procedure if you do not have a responsible adult with you.   1. Wear loose fitting clothing that is easily removed. 2. Leave jewelry and other valuables at home.  3. Remove all body piercing jewelry and leave at home. 4. Total time from sign-in until discharge is approximately 2-3 hours. 5. You should go home directly after your procedure and rest. You can resume normal activities the day after your procedure. 6. The day of your procedure you should not:  Drive  Make legal decisions  Operate machinery  Drink alcohol  Return to work   You may call the office (Dept: [phone]) before 5:00pm, or page the doctor on call [phone]) after 5:00pm, for further instructions, if necessary.   Insurance Information YOU WILL NEED TO CHECK WITH YOUR INSURANCE COMPANY FOR THE BENEFITS OF COVERAGE YOU HAVE FOR THIS PROCEDURE.  UNFORTUNATELY, NOT ALL INSURANCE COMPANIES HAVE BENEFITS TO COVER ALL OR PART OF THESE TYPES OF PROCEDURES.  IT IS YOUR RESPONSIBILITY TO CHECK YOUR BENEFITS, HOWEVER, WE WILL BE GLAD TO ASSIST YOU WITH ANY CODES YOUR INSURANCE COMPANY MAY NEED.    PLEASE NOTE THAT MOST INSURANCE COMPANIES WILL NOT COVER A SCREENING COLONOSCOPY FOR PEOPLE UNDER THE AGE OF 50  IF YOU HAVE BCBS INSURANCE, YOU MAY HAVE BENEFITS FOR A SCREENING COLONOSCOPY BUT IF POLYPS ARE FOUND THE DIAGNOSIS WILL CHANGE AND THEN YOU MAY HAVE A DEDUCTIBLE THAT WILL NEED TO BE MET. SO PLEASE MAKE SURE YOU CHECK YOUR BENEFITS FOR A SCREENING COLONOSCOPY AS WELL AS A DIAGNOSTIC COLONOSCOPY.

## 2018-05-18 NOTE — Progress Notes (Signed)
Gastroenterology Pre-Procedure Review  Request Date:05/18/18 Requesting Physician: Rowan Blase PA- Larene Pickett no previous tcs  PATIENT REVIEW QUESTIONS: The patient responded to the following health history questions as indicated:    1. Diabetes Melitis: no 2. Joint replacements in the past 12 months: no 3. Major health problems in the past 3 months: no 4. Has an artificial valve or MVP: no 5. Has a defibrillator: no 6. Has been advised in past to take antibiotics in advance of a procedure like teeth cleaning: no 7. Family history of colon cancer: no  8. Alcohol Use: no 9. History of sleep apnea: no  10. History of coronary artery or other vascular stents placed within the last 12 months: no 11. History of any prior anesthesia complications: no    MEDICATIONS & ALLERGIES:    Patient reports the following regarding taking any blood thinners:   Plavix? no Aspirin? no Coumadin? no Brilinta? no Xarelto? no Eliquis? no Pradaxa? no Savaysa? no Effient? no  Patient confirms/reports the following medications:  Current Outpatient Medications  Medication Sig Dispense Refill  . bisoprolol-hydrochlorothiazide (ZIAC) 2.5-6.25 MG tablet Take 1 tablet by mouth daily.  0  . sildenafil (VIAGRA) 100 MG tablet Take 1 tablet by mouth daily as needed for erectile dysfunction.  0   No current facility-administered medications for this visit.     Patient confirms/reports the following allergies:  No Known Allergies  No orders of the defined types were placed in this encounter.   AUTHORIZATION INFORMATION Primary Insurance: [place],  [place] #: [id number] Pre-Cert / Josem Kaufmann required:  Pre-Cert / Josem Kaufmann #:    SCHEDULE INFORMATION: Procedure has been scheduled as follows:  Date: 07/13/18, Time: 7:30 Location: [facility]  This Gastroenterology Pre-Precedure Review Form is being routed to the following provider(s): Neil Crouch, PA

## 2018-05-19 NOTE — Progress Notes (Signed)
Ok to schedule.

## 2018-06-25 NOTE — Progress Notes (Signed)
Called pts insurance- ([place]) was informed by [place] that the pts coverage started on 05/09/18 and preventative services are not covered for the first 6 months. He will have to pay for tcs if he has it now. He will not be covered for it until 11/08/18. No PA is needed. I spoke with the pt, he is going to call them himself and find out what is going on. He will call me back and let me know if he wants to cancel his 07/13/18 tcs.

## 2018-06-29 NOTE — Progress Notes (Signed)
I spoke with the pt, he does want to cancel his procedure now and wait until May to have it done due to his insurance. I have taken him off the schedule and called Kim and cancelled procedure. Advised him that I would call him when I am scheduling in May and update his triage and schedule him. He is to call if he doesn't hear from me. Pt agreed with this plan.

## 2018-07-13 ENCOUNTER — Encounter (HOSPITAL_COMMUNITY): Payer: Self-pay

## 2018-07-13 ENCOUNTER — Ambulatory Visit (HOSPITAL_COMMUNITY): Admit: 2018-07-13 | Payer: No Typology Code available for payment source | Admitting: Internal Medicine

## 2018-07-13 SURGERY — COLONOSCOPY
Anesthesia: Moderate Sedation

## 2018-07-15 ENCOUNTER — Emergency Department (HOSPITAL_COMMUNITY): Payer: No Typology Code available for payment source

## 2018-07-15 ENCOUNTER — Encounter (HOSPITAL_COMMUNITY): Payer: Self-pay | Admitting: Emergency Medicine

## 2018-07-15 ENCOUNTER — Other Ambulatory Visit: Payer: Self-pay

## 2018-07-15 ENCOUNTER — Emergency Department (HOSPITAL_COMMUNITY)
Admission: EM | Admit: 2018-07-15 | Discharge: 2018-07-15 | Disposition: A | Discharge status: Home / Self Care | Payer: No Typology Code available for payment source | Attending: Emergency Medicine | Admitting: Emergency Medicine

## 2018-07-15 DIAGNOSIS — N200 Calculus of kidney: Secondary | ICD-10-CM | POA: Insufficient documentation

## 2018-07-15 DIAGNOSIS — Z79899 Other long term (current) drug therapy: Secondary | ICD-10-CM | POA: Diagnosis not present

## 2018-07-15 DIAGNOSIS — R1084 Generalized abdominal pain: Secondary | ICD-10-CM | POA: Diagnosis present

## 2018-07-15 DIAGNOSIS — N23 Unspecified renal colic: Secondary | ICD-10-CM | POA: Diagnosis not present

## 2018-07-15 DIAGNOSIS — I10 Essential (primary) hypertension: Secondary | ICD-10-CM | POA: Diagnosis not present

## 2018-07-15 HISTORY — DX: Essential (primary) hypertension: I10

## 2018-07-15 LAB — URINALYSIS, ROUTINE W REFLEX MICROSCOPIC
BACTERIA UA: NONE SEEN
BILIRUBIN URINE: NEGATIVE
GLUCOSE, UA: NEGATIVE mg/dL
KETONES UR: NEGATIVE mg/dL
LEUKOCYTES UA: NEGATIVE
NITRITE: NEGATIVE
PH: 7 (ref 5.0–8.0)
PROTEIN: NEGATIVE mg/dL
Specific Gravity, Urine: 1.012 (ref 1.005–1.030)

## 2018-07-15 MED ORDER — MELOXICAM 15 MG PO TABS: 15.0000 mg | ORAL_TABLET | Freq: Every day | ORAL | 0 refills | Status: DC

## 2018-07-15 MED ORDER — HYDROCODONE-ACETAMINOPHEN 7.5-325 MG PO TABS: 1.0000 | ORAL_TABLET | ORAL | 0 refills | Status: DC | PRN

## 2018-07-15 MED ORDER — KETOROLAC TROMETHAMINE 60 MG/2ML IM SOLN
60.0000 mg | Freq: Once | INTRAMUSCULAR | Status: AC
  Administered 2018-07-15: 60 mg via INTRAMUSCULAR
  Filled 2018-07-15: qty 2

## 2018-07-15 MED ORDER — PROMETHAZINE HCL 12.5 MG PO TABS
25.0000 mg | ORAL_TABLET | Freq: Once | ORAL | Status: AC
  Administered 2018-07-15: 25 mg via ORAL
  Filled 2018-07-15: qty 2

## 2018-07-15 MED ORDER — OXYCODONE-ACETAMINOPHEN 5-325 MG PO TABS
2.0000 | ORAL_TABLET | Freq: Once | ORAL | Status: AC
  Administered 2018-07-15: 2 via ORAL
  Filled 2018-07-15: qty 2

## 2018-07-15 NOTE — Discharge Instructions (Addendum)
Your urine test is negative for signs of infection.  The x-ray of your abdomen and pelvis suggest that the stone previously seen on the right side is no longer there, but a new 5 mm stone is present on the right.  You also have stones on the left that do not appear to be causing an issue right now.  Please see your urology specialist as soon as possible.  Please return to the emergency department if any emergent changes in your condition, problems, or concerns.  Please use meloxicam daily with a meal.  Use Tylenol extra strength for mild pain, use Norco for more severe pain. This medication may cause drowsiness. Please do not drink, drive, or participate in activity that requires concentration while taking this medication.

## 2018-07-15 NOTE — ED Triage Notes (Signed)
Pt c/o of right flank pain since 0600 this morning.  States " I have a kidney stone"

## 2018-07-15 NOTE — ED Notes (Signed)
Pt ambulatory to the bathroom 

## 2018-07-15 NOTE — ED Provider Notes (Signed)
[facility] EMERGENCY DEPARTMENT Provider Note   CSN: [id number] Arrival date & time: 07/15/18  1545     History   Chief Complaint Chief Complaint  Patient presents with  . Flank Pain    HPI Tony Phillips is a 63 y.o. male.  The history is provided by the patient.  Flank Pain  This is a recurrent problem. The current episode started 3 to 5 hours ago. The problem occurs hourly. The problem has been gradually worsening. Pertinent negatives include no chest pain, no abdominal pain and no shortness of breath. Nothing aggravates the symptoms. Nothing relieves the symptoms. Treatments tried: advil. The treatment provided moderate relief.    Past Medical History:  Diagnosis Date  . Hypertension   . Kidney stone     There are no active problems to display for this patient.   Past Surgical History:  Procedure Laterality Date  . KNEE ARTHROSCOPY          Home Medications    Prior to Admission medications   Medication Sig Start Date End Date Taking? Authorizing Provider  bisoprolol-hydrochlorothiazide (ZIAC) 2.5-6.25 MG tablet Take 1 tablet by mouth daily. 06/05/17   [provider]  Na Sulfate-K Sulfate-Mg Sulf (SUPREP BOWEL PREP KIT) 17.5-3.13-1.6 GM/177ML SOLN Take 1 kit by mouth as directed. 05/18/18   Mahala Menghini, PA-C  sildenafil (VIAGRA) 100 MG tablet Take 1 tablet by mouth daily as needed for erectile dysfunction. 03/18/17   [provider]    Family History History reviewed. No pertinent family history.  Social History Social History   Tobacco Use  . Smoking status: Never Smoker  . Smokeless tobacco: Never Used  Substance Use Topics  . Alcohol use: No    Frequency: Never  . Drug use: No     Allergies   Patient has no known allergies.   Review of Systems Review of Systems  Constitutional: Positive for fatigue. Negative for activity change.       All ROS Neg except as noted in HPI  HENT: Negative for nosebleeds.   Eyes:  Negative for photophobia and discharge.  Respiratory: Negative for cough, shortness of breath and wheezing.   Cardiovascular: Negative for chest pain and palpitations.  Gastrointestinal: Positive for nausea. Negative for abdominal pain, blood in stool and vomiting.  Genitourinary: Positive for flank pain. Negative for dysuria, frequency and hematuria.  Musculoskeletal: Negative for arthralgias, back pain and neck pain.  Skin: Negative.   Neurological: Negative for dizziness, seizures and speech difficulty.  Psychiatric/Behavioral: Negative for confusion and hallucinations.     Physical Exam Updated Vital Signs BP (!) 182/79 (BP Location: Right Arm)   Pulse (!) 48   Temp 98.3 F (36.8 C) (Oral)   Resp 18   Ht 6' (1.829 m)   Wt 113.4 kg   SpO2 99%   BMI 33.91 kg/m   Physical Exam Vitals signs and nursing note reviewed.  Constitutional:      Appearance: He is well-developed. He is not toxic-appearing.  HENT:     Head: Normocephalic.     Right Ear: Tympanic membrane and external ear normal.     Left Ear: Tympanic membrane and external ear normal.  Eyes:     General: Lids are normal.     Pupils: Pupils are equal, round, and reactive to light.  Neck:     Musculoskeletal: Normal range of motion and neck supple.     Vascular: No carotid bruit.  Cardiovascular:     Rate and  Rhythm: Normal rate and regular rhythm.     Pulses: Normal pulses.     Heart sounds: Normal heart sounds.  Pulmonary:     Effort: No respiratory distress.     Breath sounds: Normal breath sounds.  Abdominal:     General: Bowel sounds are normal.     Palpations: Abdomen is soft.     Tenderness: There is no abdominal tenderness. There is right CVA tenderness. There is no guarding.  Musculoskeletal: Normal range of motion.  Lymphadenopathy:     Head:     Right side of head: No submandibular adenopathy.     Left side of head: No submandibular adenopathy.     Cervical: No cervical adenopathy.  Skin:     General: Skin is warm and dry.  Neurological:     Mental Status: He is alert and oriented to person, place, and time.     Cranial Nerves: No cranial nerve deficit.     Sensory: No sensory deficit.  Psychiatric:        Speech: Speech normal.      ED Treatments / Results  Labs (all labs ordered are listed, but only abnormal results are displayed) Labs Reviewed  URINALYSIS, ROUTINE W REFLEX MICROSCOPIC    EKG None  Radiology No results found.  Procedures Procedures (including critical care time)  Medications Ordered in ED Medications - No data to display   Initial Impression / Assessment and Plan / ED Course  I have reviewed the triage vital signs and the nursing notes.  Pertinent labs & imaging results that were available during my care of the patient were reviewed by me and considered in my medical decision making (see chart for details).       Final Clinical Impressions(s) / ED Diagnoses MDM  Blood pressure is elevated at 182/79.  Pulse oximetry is within normal limits at 99% on room air.  Patient is ambulatory in the room, as well as in the hall without problem.  No active vomiting reported.  Some nausea noted.  Patient had CT scan approximately 1 month ago.  Will obtain three-way abdomen x-ray.  The patient has an established diagnosis of kidney stones.  Urine analysis is pending.  Urine analysis is negative for urinary tract infection or excessive bleeding. Acute abdomen shows the previous stone of the right kidney is no longer seen.  There is a 5 mm calcification on the right at the L4-L5 area suspicious for a mid ureteral stone.  There are left nephrolithiasis.  Multiple pelvic calcifications noted.  It is difficult to distinguish if some of these may be distal stones present.  I discussed the findings with the patient in terms which he understands.  Questions have been answered.  The patient is asked to call the urology specialist on tomorrow to set up an  appointment.  He is given a prescription for Norco 7.5 mg and meloxicam 15 mg.  The patient is to return to the emergency department sooner if any changes in his condition, problems, or concerns.  Patient is in agreement with this plan.   Final diagnoses:  Renal colic on right side  Kidney stone    ED Discharge Orders         Ordered    HYDROcodone-acetaminophen (NORCO) 7.5-325 MG tablet  Every 4 hours PRN     07/15/18 2032    meloxicam (MOBIC) 15 MG tablet  Daily,   Status:  Discontinued     07/15/18 2032    meloxicam (  MOBIC) 15 MG tablet  Daily     07/15/18 2032           Lily Kocher, PA-C 07/15/18 2039    Maudie Flakes, MD 07/16/18 [phone]

## 2018-09-15 NOTE — Progress Notes (Signed)
Pt rescheduled, new orders put in and new instructions mailed to the pt.

## 2018-09-15 NOTE — Addendum Note (Signed)
Addended by: Claudina Lick on: 09/15/2018 04:06 PM   Modules accepted: Orders, SmartSet

## 2018-11-04 NOTE — Progress Notes (Signed)
Pt's procedure was re-scheduled to 12-21-2018 due to COVID 19.  Pt aware that we are mailing out new instructions.  Endo notified.

## 2018-12-13 ENCOUNTER — Telehealth: Payer: Self-pay | Admitting: *Deleted

## 2018-12-13 NOTE — Telephone Encounter (Signed)
Called pt to inform him about COVID 19 screening prior to procedure.  Pt did not schedule one yet.  He said that he needed to call me back.  Pt is aware that it will need to be scheduled this Friday 12/17/2018.

## 2018-12-14 ENCOUNTER — Telehealth: Payer: Self-pay | Admitting: *Deleted

## 2018-12-14 NOTE — Telephone Encounter (Signed)
Called pt to schedule him for COVID 19 screening.  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  Called Hoyle Sauer at [facility] and she spoke with Cleo about it.  Pt has been cleared to have a rapid test on 12/20/2018.  Pt is aware to remain in quarantine once testing has been done.  Pt voiced understanding.

## 2018-12-17 ENCOUNTER — Other Ambulatory Visit (HOSPITAL_COMMUNITY): Payer: No Typology Code available for payment source

## 2018-12-20 ENCOUNTER — Other Ambulatory Visit (HOSPITAL_COMMUNITY)
Admission: RE | Admit: 2018-12-20 | Discharge: 2018-12-20 | Disposition: A | Discharge status: Home / Self Care | Payer: No Typology Code available for payment source | Source: Ambulatory Visit | Attending: Internal Medicine | Admitting: Internal Medicine

## 2018-12-20 ENCOUNTER — Other Ambulatory Visit: Payer: Self-pay

## 2018-12-20 DIAGNOSIS — I10 Essential (primary) hypertension: Secondary | ICD-10-CM | POA: Diagnosis not present

## 2018-12-20 DIAGNOSIS — Z1159 Encounter for screening for other viral diseases: Secondary | ICD-10-CM | POA: Diagnosis not present

## 2018-12-20 DIAGNOSIS — D124 Benign neoplasm of descending colon: Secondary | ICD-10-CM | POA: Diagnosis not present

## 2018-12-20 DIAGNOSIS — D125 Benign neoplasm of sigmoid colon: Secondary | ICD-10-CM | POA: Diagnosis not present

## 2018-12-20 DIAGNOSIS — Z1211 Encounter for screening for malignant neoplasm of colon: Secondary | ICD-10-CM | POA: Diagnosis not present

## 2018-12-20 DIAGNOSIS — K5140 Inflammatory polyps of colon without complications: Secondary | ICD-10-CM | POA: Diagnosis not present

## 2018-12-20 LAB — SARS CORONAVIRUS 2 BY RT PCR (HOSPITAL ORDER, PERFORMED IN ~~LOC~~ HOSPITAL LAB): SARS Coronavirus 2: NEGATIVE

## 2018-12-21 ENCOUNTER — Encounter (HOSPITAL_COMMUNITY)
Admission: RE | Disposition: A | Discharge status: Home / Self Care | Payer: Self-pay | Source: Home / Self Care | Attending: Internal Medicine

## 2018-12-21 ENCOUNTER — Other Ambulatory Visit: Payer: Self-pay

## 2018-12-21 ENCOUNTER — Ambulatory Visit (HOSPITAL_COMMUNITY)
Admission: RE | Admit: 2018-12-21 | Discharge: 2018-12-21 | Disposition: A | Discharge status: Home / Self Care | Payer: No Typology Code available for payment source | Attending: Internal Medicine | Admitting: Internal Medicine

## 2018-12-21 ENCOUNTER — Encounter (HOSPITAL_COMMUNITY): Payer: Self-pay | Admitting: *Deleted

## 2018-12-21 DIAGNOSIS — K635 Polyp of colon: Secondary | ICD-10-CM | POA: Diagnosis not present

## 2018-12-21 DIAGNOSIS — Z1211 Encounter for screening for malignant neoplasm of colon: Secondary | ICD-10-CM | POA: Diagnosis not present

## 2018-12-21 DIAGNOSIS — D125 Benign neoplasm of sigmoid colon: Secondary | ICD-10-CM | POA: Insufficient documentation

## 2018-12-21 DIAGNOSIS — D124 Benign neoplasm of descending colon: Secondary | ICD-10-CM | POA: Insufficient documentation

## 2018-12-21 DIAGNOSIS — Z1159 Encounter for screening for other viral diseases: Secondary | ICD-10-CM | POA: Insufficient documentation

## 2018-12-21 DIAGNOSIS — K5140 Inflammatory polyps of colon without complications: Secondary | ICD-10-CM | POA: Insufficient documentation

## 2018-12-21 DIAGNOSIS — I10 Essential (primary) hypertension: Secondary | ICD-10-CM | POA: Insufficient documentation

## 2018-12-21 HISTORY — PX: POLYPECTOMY: SHX5525

## 2018-12-21 HISTORY — PX: COLONOSCOPY: SHX5424

## 2018-12-21 SURGERY — COLONOSCOPY
Anesthesia: Moderate Sedation

## 2018-12-21 MED ORDER — SODIUM CHLORIDE 0.9 % IV SOLN
INTRAVENOUS | Status: DC
  Administered 2018-12-21: 10:00:00 via INTRAVENOUS

## 2018-12-21 MED ORDER — MIDAZOLAM HCL 5 MG/5ML IJ SOLN
INTRAMUSCULAR | Status: DC | PRN
  Administered 2018-12-21: 1 mg via INTRAVENOUS
  Administered 2018-12-21: 2 mg via INTRAVENOUS
  Administered 2018-12-21: 1 mg via INTRAVENOUS
  Administered 2018-12-21: 2 mg via INTRAVENOUS

## 2018-12-21 MED ORDER — MEPERIDINE HCL 50 MG/ML IJ SOLN
INTRAMUSCULAR | Status: AC
  Filled 2018-12-21: qty 1

## 2018-12-21 MED ORDER — ONDANSETRON HCL 4 MG/2ML IJ SOLN
INTRAMUSCULAR | Status: DC | PRN
  Administered 2018-12-21: 4 mg via INTRAVENOUS

## 2018-12-21 MED ORDER — ONDANSETRON HCL 4 MG/2ML IJ SOLN
INTRAMUSCULAR | Status: AC
  Filled 2018-12-21: qty 2

## 2018-12-21 MED ORDER — MIDAZOLAM HCL 5 MG/5ML IJ SOLN
INTRAMUSCULAR | Status: AC
  Filled 2018-12-21: qty 10

## 2018-12-21 MED ORDER — MEPERIDINE HCL 100 MG/ML IJ SOLN
INTRAMUSCULAR | Status: DC | PRN
  Administered 2018-12-21: 25 mg
  Administered 2018-12-21: 15 mg via INTRAVENOUS

## 2018-12-21 NOTE — Op Note (Signed)
[facility] Patient Name: Tony Phillips Procedure Date: 12/21/2018 10:44 AM MRN: [id number] Date of Birth: 16-Feb-1956 Attending MD: Norvel Richards , MD CSN: [id number] Age: 63 Admit Type: Outpatient Procedure:                Colonoscopy Indications:              Screening for colorectal malignant neoplasm Providers:                Norvel Richards, MD, Janeece Riggers, RN, Aram Candela Referring MD:              Medicines:                Midazolam 6 mg IV, Meperidine 40 mg IV Complications:            No immediate complications. Estimated Blood Loss:     Estimated blood loss was minimal. Procedure:                Pre-Anesthesia Assessment:                           - Prior to the procedure, a History and Physical                            was performed, and patient medications and                            allergies were reviewed. The patient's tolerance of                            previous anesthesia was also reviewed. The risks                            and benefits of the procedure and the sedation                            options and risks were discussed with the patient.                            All questions were answered, and informed consent                            was obtained. Prior Anticoagulants: The patient has                            taken no previous anticoagulant or antiplatelet                            agents. ASA Grade Assessment: II - A patient with                            mild systemic disease. After reviewing the risks  and benefits, the patient was deemed in                            satisfactory condition to undergo the procedure.                           After obtaining informed consent, the colonoscope                            was passed under direct vision. Throughout the                            procedure, the patient's blood pressure, pulse, and        oxygen saturations were monitored continuously. The                            [id number]-[id number] ([id number]) scope was introduced through                            the anus and advanced to the the cecum, identified                            by appendiceal orifice and ileocecal valve. Scope In: 11:26:15 AM Scope Out: 11:41:37 AM Scope Withdrawal Time: 0 hours 12 minutes 40 seconds  Total Procedure Duration: 0 hours 15 minutes 22 seconds  Findings:      The perianal and digital rectal examinations were normal.      Two semi-pedunculated polyps were found in the mid sigmoid colon and       distal descending colon. The polyps were 6 to 8 mm in size. These polyps       were removed with a cold snare. Resection and retrieval were complete.       Estimated blood loss: none.      The exam was otherwise without abnormality on direct and retroflexion       views. Impression:               - Two 6 to 8 mm polyps in the mid sigmoid colon and                            in the distal descending colon, removed with a cold                            snare. Resected and retrieved.                           - The examination was otherwise normal on direct                            and retroflexion views. Moderate Sedation:      Moderate (conscious) sedation was administered by the endoscopy nurse       and supervised by the endoscopist. The following parameters were       monitored: oxygen saturation, heart rate, blood pressure, respiratory       rate, EKG, adequacy of pulmonary ventilation, and response  to care.       Total physician intraservice time was 23 minutes. Recommendation:           - Patient has a contact number available for                            emergencies. The signs and symptoms of potential                            delayed complications were discussed with the                            patient. Return to normal activities tomorrow.                            Written discharge  instructions were provided to the                            patient.                           - Resume previous diet.                           - Continue present medications.                           - Repeat colonoscopy date to be determined after                            pending pathology results are reviewed for                            surveillance based on pathology results.                           - Return to GI office (date not yet determined). Procedure Code(s):        --- Professional ---                           [id number], Colonoscopy, flexible; with removal of                            tumor(s), polyp(s), or other lesion(s) by snare                            technique                           99153, Moderate sedation; each additional 15                            minutes intraservice time                           G0500, Moderate sedation services provided by the  same physician or other qualified health care                            professional performing a gastrointestinal                            endoscopic service that sedation supports,                            requiring the presence of an independent trained                            observer to assist in the monitoring of the                            patient's level of consciousness and physiological                            status; initial 15 minutes of intra-service time;                            patient age 78 years or older (additional time may                            be reported with [id number], as appropriate) Diagnosis Code(s):        --- Professional ---                           Z12.11, Encounter for screening for malignant                            neoplasm of colon                           K63.5, Polyp of colon CPT copyright 2019 American Medical Association. All rights reserved. The codes documented in this report are preliminary and upon coder review may  be  revised to meet current compliance requirements. Cristopher Estimable. Sadiyah Kangas, MD Norvel Richards, MD 12/21/2018 11:51:02 AM This report has been signed electronically. Number of Addenda: 0

## 2018-12-21 NOTE — H&P (Signed)
$'@LOGO'i$ @   Primary Care Physician:  Sharilyn Sites, MD Primary Gastroenterologist:  Dr. Gala Romney  Pre-Procedure History & Physical: HPI:  Tony Phillips is a 63 y.o. male is here for a screening colonoscopy.  No bowel symptoms.  No family history of colon cancer.  No prior colonoscopy.  Past Medical History:  Diagnosis Date  . Hypertension   . Kidney stone     Past Surgical History:  Procedure Laterality Date  . KNEE ARTHROSCOPY      Prior to Admission medications   Medication Sig Start Date End Date Taking? Authorizing Provider  bisoprolol-hydrochlorothiazide (ZIAC) 2.5-6.25 MG tablet Take 1 tablet by mouth daily. 06/05/17  Yes [provider]  Na Sulfate-K Sulfate-Mg Sulf (SUPREP BOWEL PREP KIT) 17.5-3.13-1.6 GM/177ML SOLN Take 1 kit by mouth as directed. 05/18/18  Yes Mahala Menghini, PA-C  sildenafil (VIAGRA) 100 MG tablet Take 1 tablet by mouth daily as needed for erectile dysfunction. 03/18/17  Yes [provider]  HYDROcodone-acetaminophen (NORCO) 7.5-325 MG tablet Take 1 tablet by mouth every 4 (four) hours as needed. Patient not taking: Reported on 12/20/2018 07/15/18   Lily Kocher, PA-C  meloxicam (MOBIC) 15 MG tablet Take 1 tablet (15 mg total) by mouth daily. Patient not taking: Reported on 12/20/2018 07/15/18   Lily Kocher, PA-C    Allergies as of 09/15/2018  . (No Known Allergies)    History reviewed. No pertinent family history.  Social History   Socioeconomic History  . Marital status: Married    Spouse name: Not on file  . Number of children: Not on file  . Years of education: Not on file  . Highest education level: Not on file  Occupational History  . Not on file  Social Needs  . Financial resource strain: Not on file  . Food insecurity:    Worry: Not on file    Inability: Not on file  . Transportation needs:    Medical: Not on file    Non-medical: Not on file  Tobacco Use  . Smoking status: Never Smoker  . Smokeless tobacco:  Never Used  Substance and Sexual Activity  . Alcohol use: No    Frequency: Never  . Drug use: No  . Sexual activity: Yes  Lifestyle  . Physical activity:    Days per week: Not on file    Minutes per session: Not on file  . Stress: Not on file  Relationships  . Social connections:    Talks on phone: Not on file    Gets together: Not on file    Attends religious service: Not on file    Active member of club or organization: Not on file    Attends meetings of clubs or organizations: Not on file    Relationship status: Not on file  . Intimate partner violence:    Fear of current or ex partner: Not on file    Emotionally abused: Not on file    Physically abused: Not on file    Forced sexual activity: Not on file  Other Topics Concern  . Not on file  Social History Narrative  . Not on file    Review of Systems: See HPI, otherwise negative ROS  Physical Exam: BP (!) 165/78   Pulse (!) 59   Temp 98.1 F (36.7 C) (Oral)   Resp 13   Ht 6' (1.829 m)   Wt 116.1 kg   SpO2 98%   BMI 34.72 kg/m  General:   Alert,  Well-developed,  well-nourished, pleasant and cooperative in NAD Lungs:  Clear throughout to auscultation.   No wheezes, crackles, or rhonchi. No acute distress. Heart:  Regular rate and rhythm; no murmurs, clicks, rubs,  or gallops. Abdomen:  Soft, nontender and nondistended. No masses, hepatosplenomegaly or hernias noted. Normal bowel sounds, without guarding, and without rebound.   Impression/Plan: Tony Phillips is now here to undergo a screening colonoscopy.  First ever average risk screening examination.  Risks, benefits, limitations, imponderables and alternatives regarding colonoscopy have been reviewed with the patient. Questions have been answered. All parties agreeable.     Notice:  This dictation was prepared with Dragon dictation along with smaller phrase technology. Any transcriptional errors that result from this process are unintentional and may not  be corrected upon review.

## 2018-12-21 NOTE — Discharge Instructions (Signed)
Colonoscopy Discharge Instructions  Read the instructions outlined below and refer to this sheet in the next few weeks. These discharge instructions provide you with general information on caring for yourself after you leave the hospital. Your doctor may also give you specific instructions. While your treatment has been planned according to the most current medical practices available, unavoidable complications occasionally occur. If you have any problems or questions after discharge, call Dr. Gala Romney at [phone]. ACTIVITY  You may resume your regular activity, but move at a slower pace for the next 24 hours.   Take frequent rest periods for the next 24 hours.   Walking will help get rid of the air and reduce the bloated feeling in your belly (abdomen).   No driving for 24 hours (because of the medicine (anesthesia) used during the test).    Do not sign any important legal documents or operate any machinery for 24 hours (because of the anesthesia used during the test).  NUTRITION  Drink plenty of fluids.   You may resume your normal diet as instructed by your doctor.   Begin with a light meal and progress to your normal diet. Heavy or fried foods are harder to digest and may make you feel sick to your stomach (nauseated).   Avoid alcoholic beverages for 24 hours or as instructed.  MEDICATIONS  You may resume your normal medications unless your doctor tells you otherwise.  WHAT YOU CAN EXPECT TODAY  Some feelings of bloating in the abdomen.   Passage of more gas than usual.   Spotting of blood in your stool or on the toilet paper.  IF YOU HAD POLYPS REMOVED DURING THE COLONOSCOPY:  No aspirin products for 7 days or as instructed.   No alcohol for 7 days or as instructed.   Eat a soft diet for the next 24 hours.  FINDING OUT THE RESULTS OF YOUR TEST Not all test results are available during your visit. If your test results are not back during the visit, make an appointment  with your caregiver to find out the results. Do not assume everything is normal if you have not heard from your caregiver or the medical facility. It is important for you to follow up on all of your test results.  SEEK IMMEDIATE MEDICAL ATTENTION IF:  You have more than a spotting of blood in your stool.   Your belly is swollen (abdominal distention).   You are nauseated or vomiting.   You have a temperature over 101.   You have abdominal pain or discomfort that is severe or gets worse throughout the day.   Colon polyp information provided  Further recommendations to follow pending review of pathology report  I discussed my impression and recommendations with [facility] at [phone]   Colon Polyps  Polyps are tissue growths inside the body. Polyps can grow in many places, including the large intestine (colon). A polyp may be a round bump or a mushroom-shaped growth. You could have one polyp or several. Most colon polyps are noncancerous (benign). However, some colon polyps can become cancerous over time. Finding and removing the polyps early can help prevent this. What are the causes? The exact cause of colon polyps is not known. What increases the risk? You are more likely to develop this condition if you:  Have a family history of colon cancer or colon polyps.  Are older than 23 or older than 45 if you are African American.  Have inflammatory bowel disease, such as  ulcerative colitis or Crohn's disease.  Have certain hereditary conditions, such as: ? Familial adenomatous polyposis. ? Lynch syndrome. ? Turcot syndrome. ? Peutz-Jeghers syndrome.  Are overweight.  Smoke cigarettes.  Do not get enough exercise.  Drink too much alcohol.  Eat a diet that is high in fat and red meat and low in fiber.  Had childhood cancer that was treated with abdominal radiation. What are the signs or symptoms? Most polyps do not cause symptoms. If you have symptoms, they may  include:  Blood coming from your rectum when having a bowel movement.  Blood in your stool. The stool may look dark red or black.  Abdominal pain.  A change in bowel habits, such as constipation or diarrhea. How is this diagnosed? This condition is diagnosed with a colonoscopy. This is a procedure in which a lighted, flexible scope is inserted into the anus and then passed into the colon to examine the area. Polyps are sometimes found when a colonoscopy is done as part of routine cancer screening tests. How is this treated? Treatment for this condition involves removing any polyps that are found. Most polyps can be removed during a colonoscopy. Those polyps will then be tested for cancer. Additional treatment may be needed depending on the results of testing. Follow these instructions at home: Lifestyle  Maintain a healthy weight, or lose weight if recommended by your health care provider.  Exercise every day or as told by your health care provider.  Do not use any products that contain nicotine or tobacco, such as cigarettes and e-cigarettes. If you need help quitting, ask your health care provider.  If you drink alcohol, limit how much you have: ? 0-1 drink a day for women. ? 0-2 drinks a day for men.  Be aware of how much alcohol is in your drink. In the [place] one drink equals one 12 oz bottle of beer (355 mL), one 5 oz glass of wine (148 mL), or one 1 oz shot of hard liquor (44 mL). Eating and drinking   Eat foods that are high in fiber, such as fruits, vegetables, and whole grains.  Eat foods that are high in calcium and vitamin D, such as milk, cheese, yogurt, eggs, liver, fish, and broccoli.  Limit foods that are high in fat, such as fried foods and desserts.  Limit the amount of red meat and processed meat you eat, such as hot dogs, sausage, bacon, and lunch meats. General instructions  Keep all follow-up visits as told by your health care provider. This is  important. ? This includes having regularly scheduled colonoscopies. ? Talk to your health care provider about when you need a colonoscopy. Contact a health care provider if:  You have new or worsening bleeding during a bowel movement.  You have new or increased blood in your stool.  You have a change in bowel habits.  You lose weight for no known reason. Summary  Polyps are tissue growths inside the body. Polyps can grow in many places, including the colon.  Most colon polyps are noncancerous (benign), but some can become cancerous over time.  This condition is diagnosed with a colonoscopy.  Treatment for this condition involves removing any polyps that are found. Most polyps can be removed during a colonoscopy. This information is not intended to replace advice given to you by your health care provider. Make sure you discuss any questions you have with your health care provider. Document Released: 03/26/2004 Document Revised: 10/15/2017 Document Reviewed: 10/15/2017  Chartered certified accountant Patient Education  [employer].

## 2018-12-22 ENCOUNTER — Encounter: Payer: Self-pay | Admitting: Internal Medicine

## 2018-12-28 ENCOUNTER — Encounter (HOSPITAL_COMMUNITY): Payer: Self-pay | Admitting: Internal Medicine

## 2021-11-20 DIAGNOSIS — H35351 Cystoid macular degeneration, right eye: Secondary | ICD-10-CM | POA: Diagnosis not present

## 2021-12-27 DIAGNOSIS — H35351 Cystoid macular degeneration, right eye: Secondary | ICD-10-CM | POA: Diagnosis not present

## 2021-12-27 DIAGNOSIS — H3091 Unspecified chorioretinal inflammation, right eye: Secondary | ICD-10-CM | POA: Diagnosis not present

## 2022-01-07 DIAGNOSIS — Z113 Encounter for screening for infections with a predominantly sexual mode of transmission: Secondary | ICD-10-CM | POA: Diagnosis not present

## 2022-01-07 DIAGNOSIS — H3091 Unspecified chorioretinal inflammation, right eye: Secondary | ICD-10-CM | POA: Diagnosis not present

## 2022-01-10 DIAGNOSIS — H3091 Unspecified chorioretinal inflammation, right eye: Secondary | ICD-10-CM | POA: Diagnosis not present

## 2022-01-31 DIAGNOSIS — L237 Allergic contact dermatitis due to plants, except food: Secondary | ICD-10-CM | POA: Diagnosis not present

## 2022-03-07 DIAGNOSIS — H3091 Unspecified chorioretinal inflammation, right eye: Secondary | ICD-10-CM | POA: Diagnosis not present

## 2022-03-07 DIAGNOSIS — H35351 Cystoid macular degeneration, right eye: Secondary | ICD-10-CM | POA: Diagnosis not present

## 2022-04-25 DIAGNOSIS — J302 Other seasonal allergic rhinitis: Secondary | ICD-10-CM | POA: Diagnosis not present

## 2022-04-25 DIAGNOSIS — E6609 Other obesity due to excess calories: Secondary | ICD-10-CM | POA: Diagnosis not present

## 2022-04-25 DIAGNOSIS — Z6834 Body mass index (BMI) 34.0-34.9, adult: Secondary | ICD-10-CM | POA: Diagnosis not present

## 2022-04-25 DIAGNOSIS — F419 Anxiety disorder, unspecified: Secondary | ICD-10-CM | POA: Diagnosis not present

## 2022-05-18 ENCOUNTER — Other Ambulatory Visit: Payer: Self-pay

## 2022-05-18 ENCOUNTER — Emergency Department (HOSPITAL_COMMUNITY)
Admission: EM | Admit: 2022-05-18 | Discharge: 2022-05-18 | Disposition: A | Discharge status: Home / Self Care | Payer: Medicare HMO | Attending: Emergency Medicine | Admitting: Emergency Medicine

## 2022-05-18 ENCOUNTER — Encounter (HOSPITAL_COMMUNITY): Payer: Self-pay | Admitting: *Deleted

## 2022-05-18 ENCOUNTER — Emergency Department (HOSPITAL_COMMUNITY): Payer: Medicare HMO

## 2022-05-18 DIAGNOSIS — I10 Essential (primary) hypertension: Secondary | ICD-10-CM | POA: Insufficient documentation

## 2022-05-18 DIAGNOSIS — N200 Calculus of kidney: Secondary | ICD-10-CM | POA: Diagnosis not present

## 2022-05-18 DIAGNOSIS — N049 Nephrotic syndrome with unspecified morphologic changes: Secondary | ICD-10-CM | POA: Diagnosis not present

## 2022-05-18 DIAGNOSIS — E278 Other specified disorders of adrenal gland: Secondary | ICD-10-CM | POA: Diagnosis not present

## 2022-05-18 DIAGNOSIS — N132 Hydronephrosis with renal and ureteral calculous obstruction: Secondary | ICD-10-CM | POA: Diagnosis not present

## 2022-05-18 DIAGNOSIS — Z79899 Other long term (current) drug therapy: Secondary | ICD-10-CM | POA: Insufficient documentation

## 2022-05-18 DIAGNOSIS — R600 Localized edema: Secondary | ICD-10-CM | POA: Diagnosis not present

## 2022-05-18 DIAGNOSIS — R109 Unspecified abdominal pain: Secondary | ICD-10-CM | POA: Diagnosis present

## 2022-05-18 LAB — CBC WITH DIFFERENTIAL/PLATELET
Abs Immature Granulocytes: 0.02 10*3/uL (ref 0.00–0.07)
Basophils Absolute: 0 10*3/uL (ref 0.0–0.1)
Basophils Relative: 0 %
Eosinophils Absolute: 0 10*3/uL (ref 0.0–0.5)
Eosinophils Relative: 0 %
HCT: 40.3 % (ref 39.0–52.0)
Hemoglobin: 13.7 g/dL (ref 13.0–17.0)
Immature Granulocytes: 0 %
Lymphocytes Relative: 12 %
Lymphs Abs: 1.1 10*3/uL (ref 0.7–4.0)
MCH: 31.9 pg (ref 26.0–34.0)
MCHC: 34 g/dL (ref 30.0–36.0)
MCV: 93.7 fL (ref 80.0–100.0)
Monocytes Absolute: 0.6 10*3/uL (ref 0.1–1.0)
Monocytes Relative: 6 %
Neutro Abs: 7.9 10*3/uL — ABNORMAL HIGH (ref 1.7–7.7)
Neutrophils Relative %: 82 %
Platelets: 243 10*3/uL (ref 150–400)
RBC: 4.3 MIL/uL (ref 4.22–5.81)
RDW: 13.3 % (ref 11.5–15.5)
WBC: 9.6 10*3/uL (ref 4.0–10.5)
nRBC: 0 % (ref 0.0–0.2)

## 2022-05-18 LAB — BASIC METABOLIC PANEL
Anion gap: 8 (ref 5–15)
BUN: 18 mg/dL (ref 8–23)
CO2: 25 mmol/L (ref 22–32)
Calcium: 8.8 mg/dL — ABNORMAL LOW (ref 8.9–10.3)
Chloride: 101 mmol/L (ref 98–111)
Creatinine, Ser: 1.23 mg/dL (ref 0.61–1.24)
GFR, Estimated: >60 mL/min (ref >60)
Glucose, Bld: 127 mg/dL — ABNORMAL HIGH (ref 70–99)
Potassium: 3.4 mmol/L — ABNORMAL LOW (ref 3.5–5.1)
Sodium: 134 mmol/L — ABNORMAL LOW (ref 135–145)

## 2022-05-18 LAB — URINALYSIS, ROUTINE W REFLEX MICROSCOPIC
Bacteria, UA: NONE SEEN
Bilirubin Urine: NEGATIVE
Glucose, UA: 50 mg/dL — AB
Ketones, ur: NEGATIVE mg/dL
Leukocytes,Ua: NEGATIVE
Nitrite: NEGATIVE
Protein, ur: NEGATIVE mg/dL
Specific Gravity, Urine: 1.016 (ref 1.005–1.030)
pH: 7 (ref 5.0–8.0)

## 2022-05-18 MED ORDER — ONDANSETRON HCL 4 MG/2ML IJ SOLN
4.0000 mg | Freq: Once | INTRAMUSCULAR | Status: AC
  Administered 2022-05-18: 4 mg via INTRAVENOUS
  Filled 2022-05-18: qty 2

## 2022-05-18 MED ORDER — OXYCODONE-ACETAMINOPHEN 5-325 MG PO TABS: 1.0000 | ORAL_TABLET | Freq: Three times a day (TID) | ORAL | 0 refills | Status: DC | PRN

## 2022-05-18 MED ORDER — HYDROMORPHONE HCL 1 MG/ML IJ SOLN
0.5000 mg | Freq: Once | INTRAMUSCULAR | Status: AC
  Administered 2022-05-18: 0.5 mg via INTRAVENOUS
  Filled 2022-05-18: qty 0.5

## 2022-05-18 MED ORDER — ONDANSETRON 4 MG PO TBDP: 4.0000 mg | ORAL_TABLET | Freq: Three times a day (TID) | ORAL | 0 refills | Status: DC | PRN

## 2022-05-18 MED ORDER — KETOROLAC TROMETHAMINE 15 MG/ML IJ SOLN
15.0000 mg | Freq: Once | INTRAMUSCULAR | Status: AC
  Administered 2022-05-18: 15 mg via INTRAVENOUS
  Filled 2022-05-18: qty 1

## 2022-05-18 NOTE — ED Triage Notes (Addendum)
Pt c/o right flank and right sided abdominal pain that started early this morning around 0430 along with n/v. Hx kidney stones. Denies urinary symptoms and fever.

## 2022-05-18 NOTE — ED Provider Notes (Signed)
[facility] EMERGENCY DEPARTMENT Provider Note   CSN: [id number] Arrival date & time: 05/18/22  2706     History  Chief Complaint  Patient presents with   Flank Pain    Tony Phillips is a 66 y.o. male.   Flank Pain  Patient with history of multiple kidney stones.  Now at around 4:30 in the morning developed right flank pain.  Does go to the right abdomen.  Similar to previous stones.  No fevers.  Some nausea.    Past Medical History:  Diagnosis Date   Hypertension    Kidney stone     Home Medications Prior to Admission medications   Medication Sig Start Date End Date Taking? Authorizing Provider  oxyCODONE-acetaminophen (PERCOCET/ROXICET) 5-325 MG tablet Take 1-2 tablets by mouth every 8 (eight) hours as needed for severe pain. 05/18/22  Yes Davonna Belling, MD  bisoprolol-hydrochlorothiazide [employer]) 2.5-6.25 MG tablet Take 1 tablet by mouth daily. 06/05/17   [provider]  ondansetron (ZOFRAN-ODT) 4 MG disintegrating tablet Take 1 tablet (4 mg total) by mouth every 8 (eight) hours as needed for nausea or vomiting. 05/18/22   Davonna Belling, MD  sildenafil (VIAGRA) 100 MG tablet Take 1 tablet by mouth daily as needed for erectile dysfunction. 03/18/17   [provider]      Allergies    Patient has no known allergies.    Review of Systems   Review of Systems  Genitourinary:  Positive for flank pain.    Physical Exam Updated Vital Signs BP (!) 156/76 (BP Location: Left Arm)   Pulse (!) 58   Temp 97.7 F (36.5 C) (Oral)   Resp 16   Ht 6' (1.829 m)   Wt 112 kg   SpO2 99%   BMI 33.50 kg/m  Physical Exam Vitals and nursing note reviewed.  Eyes:     Pupils: Pupils are equal, round, and reactive to light.  Cardiovascular:     Rate and Rhythm: Regular rhythm.  Abdominal:     Tenderness: There is abdominal tenderness.     Comments: Right flank tenderness.  No rebound or guarding.  No hernia palpated.  Neurological:     Mental Status:  He is alert.     ED Results / Procedures / Treatments   Labs (all labs ordered are listed, but only abnormal results are displayed) Labs Reviewed  BASIC METABOLIC PANEL - Abnormal; Notable for the following components:      Result Value   Sodium 134 (*)    Potassium 3.4 (*)    Glucose, Bld 127 (*)    Calcium 8.8 (*)    All other components within normal limits  CBC WITH DIFFERENTIAL/PLATELET - Abnormal; Notable for the following components:   Neutro Abs 7.9 (*)    All other components within normal limits  URINALYSIS, ROUTINE W REFLEX MICROSCOPIC - Abnormal; Notable for the following components:   Glucose, UA 50 (*)    Hgb urine dipstick SMALL (*)    All other components within normal limits    EKG None  Radiology CT Renal Stone Study  Result Date: 05/18/2022 CLINICAL DATA:  Right flank pain.  Kidney stones suspected. EXAM: CT ABDOMEN AND PELVIS WITHOUT CONTRAST TECHNIQUE: Multidetector CT imaging of the abdomen and pelvis was performed following the standard protocol without IV contrast. RADIATION DOSE REDUCTION: This exam was performed according to the departmental dose-optimization program which includes automated exposure control, adjustment of the mA and/or kV according to patient size and/or use of  iterative reconstruction technique. COMPARISON:  06/11/2017 FINDINGS: Lower chest: Diffuse peripheral tree-in-bud nodularity again noted without substantial interval change. Hepatobiliary: Stable 11 mm hypodensity in the central right liver consistent with a cyst. No followup imaging is recommended. Gallbladder is surgically absent. No intrahepatic or extrahepatic biliary dilation. Pancreas: No focal mass lesion. No dilatation of the main duct. No intraparenchymal cyst. No peripancreatic edema. Spleen: No splenomegaly. No focal mass lesion. Adrenals/Urinary Tract: Stable nodular thickening of the adrenal glands consistent with benign etiology such as hyperplasia. 2 cm water density  lesion upper interpolar left kidney is similar to prior compatible with a cyst. No followup imaging is recommended. Punctate stone identified interpolar right kidney with another punctate stone in the lower pole right kidney mild right hydroureteronephrosis with perinephric and periureteric edema secondary to the presence of a 3-4 mm stone in the right UVJ. Multiple stones are seen in the left kidney without hydronephrosis including a 1.4 x 0.8 x 1.0 cm stone in the left renal pelvis. No left ureteral stone. Stomach/Bowel: Stomach is unremarkable. No gastric wall thickening. No evidence of outlet obstruction. Duodenum is normally positioned as is the ligament of Treitz. No small bowel wall thickening. No small bowel dilatation. The terminal ileum is normal. The appendix is normal. No gross colonic mass. No colonic wall thickening. Vascular/Lymphatic: No abdominal aortic aneurysm. No abdominal aortic atherosclerotic calcification. There is no gastrohepatic or hepatoduodenal ligament lymphadenopathy. No retroperitoneal or mesenteric lymphadenopathy. No pelvic sidewall lymphadenopathy. Reproductive: The prostate gland and seminal vesicles are unremarkable. Other: No intraperitoneal free fluid. Musculoskeletal: Stable sclerotic focus left iliac bone consistent with bone island. No worrisome lytic or sclerotic osseous abnormality. IMPRESSION: 1. 3-4 mm right UVJ stone with mild right hydroureteronephrosis and perinephric and periureteric edema. 2. Bilateral nonobstructing nephrolithiasis. 3. Diffuse chronic peripheral tree-in-bud nodularity again noted without substantial interval change. Electronically Signed   By: Misty Stanley M.D.   On: 05/18/2022 08:59    Procedures Procedures    Medications Ordered in ED Medications  ondansetron (ZOFRAN) injection 4 mg (4 mg Intravenous Given 05/18/22 0834)  HYDROmorphone (DILAUDID) injection 0.5 mg (0.5 mg Intravenous Given 05/18/22 0834)  ketorolac (TORADOL) 15 MG/ML  injection 15 mg (15 mg Intravenous Given 05/18/22 6962)    ED Course/ Medical Decision Making/ A&P                           Medical Decision Making Amount and/or Complexity of Data Reviewed Labs: ordered. Radiology: ordered.  Risk Prescription drug management.   Patient with right flank pain.  History of kidney stones.  Last CT imaging was around 5 years ago.  No fevers.  We will get basic blood work.  Differential diagnosis includes bowel obstruction kidney stones and UTI.  Will get CT scan to evaluate and urinalysis.  Will treat with antiemetics and pain control.  CT scan does show very distal ureteral stone.  Feels better after treatment.  Urine does not show infection.  Will discharge and follow-up with urology.  Had seen a different urologist out of town but will have follow with urology here.        Final Clinical Impression(s) / ED Diagnoses Final diagnoses:  Kidney stone    Rx / DC Orders ED Discharge Orders          Ordered    ondansetron (ZOFRAN-ODT) 4 MG disintegrating tablet  Every 8 hours PRN,   Status:  Discontinued  05/18/22 1016    oxyCODONE-acetaminophen (PERCOCET/ROXICET) 5-325 MG tablet  Every 8 hours PRN        05/18/22 1016    ondansetron (ZOFRAN-ODT) 4 MG disintegrating tablet  Every 8 hours PRN        05/18/22 1017              Davonna Belling, MD 05/18/22 1548

## 2022-05-18 NOTE — ED Notes (Signed)
Pt is working on getting a urine sample at this time.

## 2022-08-18 DIAGNOSIS — E782 Mixed hyperlipidemia: Secondary | ICD-10-CM | POA: Diagnosis not present

## 2022-08-18 DIAGNOSIS — E119 Type 2 diabetes mellitus without complications: Secondary | ICD-10-CM | POA: Diagnosis not present

## 2022-08-18 DIAGNOSIS — N529 Male erectile dysfunction, unspecified: Secondary | ICD-10-CM | POA: Diagnosis not present

## 2022-08-18 DIAGNOSIS — Z0001 Encounter for general adult medical examination with abnormal findings: Secondary | ICD-10-CM | POA: Diagnosis not present

## 2022-08-18 DIAGNOSIS — Z6834 Body mass index (BMI) 34.0-34.9, adult: Secondary | ICD-10-CM | POA: Diagnosis not present

## 2022-08-18 DIAGNOSIS — E6609 Other obesity due to excess calories: Secondary | ICD-10-CM | POA: Diagnosis not present

## 2022-08-18 DIAGNOSIS — R001 Bradycardia, unspecified: Secondary | ICD-10-CM | POA: Diagnosis not present

## 2022-08-18 DIAGNOSIS — Z1331 Encounter for screening for depression: Secondary | ICD-10-CM | POA: Diagnosis not present

## 2022-08-18 DIAGNOSIS — K635 Polyp of colon: Secondary | ICD-10-CM | POA: Diagnosis not present

## 2022-11-26 DIAGNOSIS — I10 Essential (primary) hypertension: Secondary | ICD-10-CM | POA: Diagnosis not present

## 2022-12-12 DIAGNOSIS — H3091 Unspecified chorioretinal inflammation, right eye: Secondary | ICD-10-CM | POA: Diagnosis not present

## 2022-12-12 DIAGNOSIS — Z01818 Encounter for other preprocedural examination: Secondary | ICD-10-CM | POA: Diagnosis not present

## 2022-12-12 DIAGNOSIS — H27131 Posterior dislocation of lens, right eye: Secondary | ICD-10-CM | POA: Diagnosis not present

## 2022-12-12 DIAGNOSIS — H35351 Cystoid macular degeneration, right eye: Secondary | ICD-10-CM | POA: Diagnosis not present

## 2023-01-06 DIAGNOSIS — H27131 Posterior dislocation of lens, right eye: Secondary | ICD-10-CM | POA: Diagnosis not present

## 2023-01-06 DIAGNOSIS — H4389 Other disorders of vitreous body: Secondary | ICD-10-CM | POA: Diagnosis not present

## 2023-01-06 DIAGNOSIS — T8522XA Displacement of intraocular lens, initial encounter: Secondary | ICD-10-CM | POA: Diagnosis not present

## 2023-01-14 DIAGNOSIS — H35351 Cystoid macular degeneration, right eye: Secondary | ICD-10-CM | POA: Diagnosis not present

## 2023-01-30 ENCOUNTER — Other Ambulatory Visit: Payer: Self-pay

## 2023-01-30 ENCOUNTER — Encounter (HOSPITAL_COMMUNITY): Payer: Self-pay | Admitting: Emergency Medicine

## 2023-01-30 ENCOUNTER — Emergency Department (HOSPITAL_COMMUNITY)
Admission: EM | Admit: 2023-01-30 | Discharge: 2023-01-30 | Disposition: A | Discharge status: Home / Self Care | Payer: Medicare HMO | Attending: Emergency Medicine | Admitting: Emergency Medicine

## 2023-01-30 ENCOUNTER — Emergency Department (HOSPITAL_COMMUNITY): Payer: Medicare HMO

## 2023-01-30 DIAGNOSIS — K7689 Other specified diseases of liver: Secondary | ICD-10-CM | POA: Diagnosis not present

## 2023-01-30 DIAGNOSIS — N132 Hydronephrosis with renal and ureteral calculous obstruction: Secondary | ICD-10-CM | POA: Diagnosis not present

## 2023-01-30 DIAGNOSIS — N200 Calculus of kidney: Secondary | ICD-10-CM

## 2023-01-30 DIAGNOSIS — K429 Umbilical hernia without obstruction or gangrene: Secondary | ICD-10-CM | POA: Diagnosis not present

## 2023-01-30 DIAGNOSIS — R109 Unspecified abdominal pain: Secondary | ICD-10-CM

## 2023-01-30 LAB — URINALYSIS, ROUTINE W REFLEX MICROSCOPIC
Bacteria, UA: NONE SEEN
Bilirubin Urine: NEGATIVE
Glucose, UA: NEGATIVE mg/dL
Ketones, ur: NEGATIVE mg/dL
Leukocytes,Ua: NEGATIVE
Nitrite: NEGATIVE
Protein, ur: NEGATIVE mg/dL
Specific Gravity, Urine: 1.019 (ref 1.005–1.030)
pH: 7 (ref 5.0–8.0)

## 2023-01-30 MED ORDER — ONDANSETRON HCL 4 MG/2ML IJ SOLN: 4.0000 mg | Freq: Once | INTRAMUSCULAR | Status: DC

## 2023-01-30 MED ORDER — AMLODIPINE BESYLATE 5 MG PO TABS
10.0000 mg | ORAL_TABLET | Freq: Once | ORAL | Status: AC
  Administered 2023-01-30: 10 mg via ORAL

## 2023-01-30 MED ORDER — ONDANSETRON HCL 4 MG/2ML IJ SOLN
4.0000 mg | Freq: Once | INTRAMUSCULAR | Status: AC
  Administered 2023-01-30: 4 mg via INTRAVENOUS

## 2023-01-30 MED ORDER — KETOROLAC TROMETHAMINE 10 MG PO TABS: 10.0000 mg | ORAL_TABLET | Freq: Four times a day (QID) | ORAL | 0 refills | Status: DC | PRN

## 2023-01-30 MED ORDER — ONDANSETRON 4 MG PO TBDP: ORAL_TABLET | ORAL | 0 refills | Status: DC

## 2023-01-30 MED ORDER — KETOROLAC TROMETHAMINE 30 MG/ML IJ SOLN
15.0000 mg | Freq: Once | INTRAMUSCULAR | Status: AC
  Administered 2023-01-30: 15 mg via INTRAVENOUS

## 2023-01-30 MED ORDER — MORPHINE SULFATE (PF) 4 MG/ML IV SOLN
4.0000 mg | Freq: Once | INTRAVENOUS | Status: AC
  Administered 2023-01-30: 4 mg via INTRAVENOUS

## 2023-01-30 MED ORDER — OXYCODONE-ACETAMINOPHEN 5-325 MG PO TABS: 1.0000 | ORAL_TABLET | Freq: Four times a day (QID) | ORAL | 0 refills | Status: DC | PRN

## 2023-01-30 MED ORDER — HYDROMORPHONE HCL 1 MG/ML IJ SOLN: 0.5000 mg | Freq: Once | INTRAMUSCULAR | Status: DC

## 2023-01-30 MED ORDER — MORPHINE SULFATE (PF) 4 MG/ML IV SOLN: 4.0000 mg | Freq: Once | INTRAVENOUS | Status: DC

## 2023-01-30 NOTE — ED Triage Notes (Addendum)
Pt in with sharp L flank pain since 0200, pain radiates to L abdomen. Hx of kidney stones, denies any fevers or blood in urine. + nausea. Pt also states he was playing basketball last night, thinks that he was jabbed by an elbow to the same location of pain today

## 2023-01-30 NOTE — ED Provider Notes (Signed)
[place] EMERGENCY DEPARTMENT AT [facility] Provider Note   CSN: [id number] Arrival date & time: 01/30/23  0601     History  Chief Complaint  Patient presents with   Flank Pain    Tony Phillips is a 67 y.o. male.  Woke up at 2AM by left flank pain. Has a history of kidney stones, feels similar. Does report that he played basketball and might have been elbowed.       Home Medications Prior to Admission medications   Medication Sig Start Date End Date Taking? Authorizing Provider  bisoprolol-hydrochlorothiazide (ZIAC) 2.5-6.25 MG tablet Take 1 tablet by mouth daily. 06/05/17   [provider]  ondansetron (ZOFRAN-ODT) 4 MG disintegrating tablet Take 1 tablet (4 mg total) by mouth every 8 (eight) hours as needed for nausea or vomiting. 05/18/22   Benjiman Core, MD  oxyCODONE-acetaminophen (PERCOCET/ROXICET) 5-325 MG tablet Take 1-2 tablets by mouth every 8 (eight) hours as needed for severe pain. 05/18/22   Benjiman Core, MD  sildenafil (VIAGRA) 100 MG tablet Take 1 tablet by mouth daily as needed for erectile dysfunction. 03/18/17   [provider]      Allergies    Patient has no known allergies.    Review of Systems   Review of Systems  Physical Exam Updated Vital Signs BP (!) 154/79   Pulse (!) 56   Temp 97.7 F (36.5 C) (Oral)   Resp 20   Wt 112.5 kg   SpO2 97%   BMI 33.63 kg/m  Physical Exam Vitals and nursing note reviewed.  Constitutional:      General: He is not in acute distress.    Appearance: He is well-developed.  HENT:     Head: Normocephalic and atraumatic.     Mouth/Throat:     Mouth: Mucous membranes are moist.  Eyes:     General: Vision grossly intact. Gaze aligned appropriately.     Extraocular Movements: Extraocular movements intact.     Conjunctiva/sclera: Conjunctivae normal.  Cardiovascular:     Rate and Rhythm: Normal rate and regular rhythm.     Pulses: Normal pulses.     Heart sounds: Normal  heart sounds, S1 normal and S2 normal. No murmur heard.    No friction rub. No gallop.  Pulmonary:     Effort: Pulmonary effort is normal. No respiratory distress.     Breath sounds: Normal breath sounds.  Abdominal:     Palpations: Abdomen is soft.     Tenderness: There is no abdominal tenderness. There is no guarding or rebound.     Hernia: No hernia is present.  Musculoskeletal:        General: No swelling.     Cervical back: Full passive range of motion without pain, normal range of motion and neck supple. No pain with movement, spinous process tenderness or muscular tenderness. Normal range of motion.     Right lower leg: No edema.     Left lower leg: No edema.  Skin:    General: Skin is warm and dry.     Capillary Refill: Capillary refill takes less than 2 seconds.     Findings: No ecchymosis, erythema, lesion or wound.  Neurological:     Mental Status: He is alert and oriented to person, place, and time.     GCS: GCS eye subscore is 4. GCS verbal subscore is 5. GCS motor subscore is 6.     Cranial Nerves: Cranial nerves 2-12 are intact.  Sensory: Sensation is intact.     Motor: Motor function is intact. No weakness or abnormal muscle tone.     Coordination: Coordination is intact.  Psychiatric:        Mood and Affect: Mood normal.        Speech: Speech normal.        Behavior: Behavior normal.     ED Results / Procedures / Treatments   Labs (all labs ordered are listed, but only abnormal results are displayed) Labs Reviewed  URINALYSIS, ROUTINE W REFLEX MICROSCOPIC    EKG None  Radiology No results found.  Procedures Procedures    Medications Ordered in ED Medications  morphine (PF) 4 MG/ML injection 4 mg (has no administration in time range)  ondansetron (ZOFRAN) injection 4 mg (4 mg Intravenous Given 01/30/23 0650)    ED Course/ Medical Decision Making/ A&P                             Medical Decision Making Amount and/or Complexity of Data  Reviewed Labs: ordered. Radiology: ordered.  Risk Prescription drug management.   Differential Diagnosis considered includes, but not limited to: Renal colic/kidney stone; pyelonephritis; aortic dissection; musculoskeletal pain.  History of kidneys stones with flank pain. Check CT renal and urine to determine if ureterolithiasis, uti or other etiology . Sign out to oncoming physican.        Final Clinical Impression(s) / ED Diagnoses Final diagnoses:  Flank pain    Rx / DC Orders ED Discharge Orders     None         Amari Zagal, Canary Brim, MD 01/30/23 [id number]

## 2023-01-30 NOTE — ED Provider Notes (Signed)
Pt with a 11 by 7 cm kidney stone in left upj.   I spoke with urology and the pt can follow up next week and get scheduled for lithotripsy   Bethann Berkshire, MD 01/30/23 1139

## 2023-01-30 NOTE — Discharge Instructions (Signed)
Call [facility] today and get an appointment next week.   If you have problems over this weekend then go to [name] in [place]

## 2023-02-03 ENCOUNTER — Ambulatory Visit: Payer: Self-pay | Admitting: Urology

## 2023-02-03 ENCOUNTER — Ambulatory Visit: Payer: Medicare HMO | Admitting: Urology

## 2023-02-03 ENCOUNTER — Encounter: Payer: Self-pay | Admitting: Urology

## 2023-02-03 ENCOUNTER — Ambulatory Visit (HOSPITAL_BASED_OUTPATIENT_CLINIC_OR_DEPARTMENT_OTHER)
Admission: RE | Admit: 2023-02-03 | Discharge: 2023-02-03 | Disposition: A | Discharge status: Home / Self Care | Payer: Medicare HMO | Source: Ambulatory Visit | Attending: Urology | Admitting: Urology

## 2023-02-03 VITALS — BP 185/89 | HR 52 | Ht 72.0 in | Wt 250.0 lb

## 2023-02-03 DIAGNOSIS — N200 Calculus of kidney: Secondary | ICD-10-CM

## 2023-02-03 DIAGNOSIS — N202 Calculus of kidney with calculus of ureter: Secondary | ICD-10-CM | POA: Diagnosis not present

## 2023-02-03 DIAGNOSIS — N201 Calculus of ureter: Secondary | ICD-10-CM

## 2023-02-03 MED ORDER — TAMSULOSIN HCL 0.4 MG PO CAPS: 0.4000 mg | ORAL_CAPSULE | Freq: Every day | ORAL | 0 refills | Status: DC

## 2023-02-03 NOTE — Progress Notes (Addendum)
Assessment: 1. Nephrolithiasis      Plan: Today I had a long discussion with the patient and his wife regarding management options for his 11 mm left UPJ stone.  We discussed ESL as well as endoscopic management.  I discussed these options in detail including stone free rates as well as need for secondary procedures and adverse events and complications. Patient has had ESL previously and would prefer this management.  We discussed the option of prestenting and also reviewed the risk of obstructive fragments and need for additional procedures.  He expresses understanding of these issues and agrees to proceed.  Chief Complaint: Nephrolithiasis  History of Present Illness:  Tony Phillips is a 67 y.o. male who is seen in consultation from Assunta Found, MD for evaluation of nephrolithiasis. Patient has a prior history of recurrent stone disease. He presented 4 days ago to the emergency department with acute onset of left-sided flank pain and nausea.  Since that time his pain has diminished significantly.  He states that it is a intermittent dull achy sensation at present.  He denies any further nausea.  CT stone study 01/30/2023-- There is a 7 x 11 mm left ureteropelvic junction calculus causing proximal moderate hydronephrosis. There is also perirenal fat stranding. Left ureter distal to the calculus is nondilated. There is a 5 mm calculus in the left kidney lower pole calyx.   There is a punctate nonobstructing calculus in the right kidney lower pole calyx. No other right nephroureterolithiasis or obstructive uropathy.   Urinary bladder is under distended, precluding optimal assessment. However, no large mass or stones identified. No perivesical fat stranding.  KUB today shows a 11 mm stone at the UPJ region easily visualized   Past Medical History:  Past Medical History:  Diagnosis Date   Hypertension    Kidney stone     Past Surgical History:  Past Surgical History:   Procedure Laterality Date   COLONOSCOPY N/A 12/21/2018   Procedure: COLONOSCOPY;  Surgeon: Corbin Ade, MD;  [facility];  Service: Endoscopy;  Laterality: N/A;  8:30   KNEE ARTHROSCOPY     POLYPECTOMY  12/21/2018   Procedure: POLYPECTOMY;  Surgeon: Corbin Ade, MD;  [facility]: [facility];  Service: Endoscopy;;    Allergies:  No Known Allergies  Family History:  No family history on file.  Social History:  Social History   Tobacco Use   Smoking status: Never   Smokeless tobacco: Never  Vaping Use   Vaping status: Never Used  Substance Use Topics   Alcohol use: No   Drug use: No    Review of symptoms:  Constitutional:  Negative for unexplained weight loss, night sweats, fever, chills ENT:  Negative for nose bleeds, sinus pain, painful swallowing CV:  Negative for chest pain, shortness of breath, exercise intolerance, palpitations, loss of consciousness Resp:  Negative for cough, wheezing, shortness of breath GI:  Negative for nausea, vomiting, diarrhea, bloody stools GU:  Positives noted in HPI; otherwise negative for gross hematuria, dysuria, urinary incontinence Neuro:  Negative for seizures, poor balance, limb weakness, slurred speech Psych:  Negative for lack of energy, depression, anxiety Endocrine:  Negative for polydipsia, polyuria, symptoms of hypoglycemia (dizziness, hunger, sweating) Hematologic:  Negative for anemia, purpura, petechia, prolonged or excessive bleeding, use of anticoagulants  Allergic:  Negative for difficulty breathing or choking as a result of exposure to anything; no shellfish allergy; no allergic response (rash/itch) to materials, foods  Physical exam: BP Marland Kitchen)  185/89   Pulse (!) 52   Ht 6' (1.829 m)   Wt 250 lb (113.4 kg)   BMI 33.91 kg/m  GENERAL APPEARANCE:  Well appearing, well developed, well nourished, NAD HEENT: Atraumatic, Normocephalic, oropharynx clear. NECK: Supple without lymphadenopathy or  thyromegaly. LUNGS: Clear to auscultation bilaterally. HEART: Regular Rate and Rhythm without murmurs, gallops, or rubs. ABDOMEN: Soft, non-tender, No Masses. EXTREMITIES: Moves all extremities well.  Without clubbing, cyanosis, or edema. NEUROLOGIC:  Alert and oriented x 3, normal gait, CN II-XII grossly intact.  MENTAL STATUS:  Appropriate. BACK:  Non-tender to palpation.  No CVAT SKIN:  Warm, dry and intact.    Results: Urinalysis clear

## 2023-02-03 NOTE — H&P (View-Only) (Signed)
Assessment: 1. Nephrolithiasis      Plan: Today I had a long discussion with the patient and his wife regarding management options for his 11 mm left UPJ stone.  We discussed ESL as well as endoscopic management.  I discussed these options in detail including stone free rates as well as need for secondary procedures and adverse events and complications. Patient has had ESL previously and would prefer this management.  We discussed the option of prestenting and also reviewed the risk of obstructive fragments and need for additional procedures.  He expresses understanding of these issues and agrees to proceed.  Chief Complaint: Nephrolithiasis  History of Present Illness:  Tony Phillips is a 67 y.o. male who is seen in consultation from Assunta Found, MD for evaluation of nephrolithiasis. Patient has a prior history of recurrent stone disease. He presented 4 days ago to the emergency department with acute onset of left-sided flank pain and nausea.  Since that time his pain has diminished significantly.  He states that it is a intermittent dull achy sensation at present.  He denies any further nausea.  CT stone study 01/30/2023-- There is a 7 x 11 mm left ureteropelvic junction calculus causing proximal moderate hydronephrosis. There is also perirenal fat stranding. Left ureter distal to the calculus is nondilated. There is a 5 mm calculus in the left kidney lower pole calyx.   There is a punctate nonobstructing calculus in the right kidney lower pole calyx. No other right nephroureterolithiasis or obstructive uropathy.   Urinary bladder is under distended, precluding optimal assessment. However, no large mass or stones identified. No perivesical fat stranding.  KUB today shows a 11 mm stone at the UPJ region easily visualized   Past Medical History:  Past Medical History:  Diagnosis Date   Hypertension    Kidney stone     Past Surgical History:  Past Surgical History:   Procedure Laterality Date   COLONOSCOPY N/A 12/21/2018   Procedure: COLONOSCOPY;  Surgeon: Corbin Ade, MD;  [facility];  Service: Endoscopy;  Laterality: N/A;  8:30   KNEE ARTHROSCOPY     POLYPECTOMY  12/21/2018   Procedure: POLYPECTOMY;  Surgeon: Corbin Ade, MD;  [facility];  Service: Endoscopy;;    Allergies:  No Known Allergies  Family History:  No family history on file.  Social History:  Social History   Tobacco Use   Smoking status: Never   Smokeless tobacco: Never  Vaping Use   Vaping status: Never Used  Substance Use Topics   Alcohol use: No   Drug use: No    Review of symptoms:  Constitutional:  Negative for unexplained weight loss, night sweats, fever, chills ENT:  Negative for nose bleeds, sinus pain, painful swallowing CV:  Negative for chest pain, shortness of breath, exercise intolerance, palpitations, loss of consciousness Resp:  Negative for cough, wheezing, shortness of breath GI:  Negative for nausea, vomiting, diarrhea, bloody stools GU:  Positives noted in HPI; otherwise negative for gross hematuria, dysuria, urinary incontinence Neuro:  Negative for seizures, poor balance, limb weakness, slurred speech Psych:  Negative for lack of energy, depression, anxiety Endocrine:  Negative for polydipsia, polyuria, symptoms of hypoglycemia (dizziness, hunger, sweating) Hematologic:  Negative for anemia, purpura, petechia, prolonged or excessive bleeding, use of anticoagulants  Allergic:  Negative for difficulty breathing or choking as a result of exposure to anything; no shellfish allergy; no allergic response (rash/itch) to materials, foods  Physical exam: BP Marland Kitchen)  185/89   Pulse (!) 52   Ht 6' (1.829 m)   Wt 250 lb (113.4 kg)   BMI 33.91 kg/m  GENERAL APPEARANCE:  Well appearing, well developed, well nourished, NAD HEENT: Atraumatic, Normocephalic, oropharynx clear. NECK: Supple without lymphadenopathy or  thyromegaly. LUNGS: Clear to auscultation bilaterally. HEART: Regular Rate and Rhythm without murmurs, gallops, or rubs. ABDOMEN: Soft, non-tender, No Masses. EXTREMITIES: Moves all extremities well.  Without clubbing, cyanosis, or edema. NEUROLOGIC:  Alert and oriented x 3, normal gait, CN II-XII grossly intact.  MENTAL STATUS:  Appropriate. BACK:  Non-tender to palpation.  No CVAT SKIN:  Warm, dry and intact.    Results: Urinalysis clear

## 2023-02-04 NOTE — Progress Notes (Signed)
Patient was called by RN and given preop instructions for his Monday litho. Patient told to be at surgery center at 1330 on Monday July 29th. No solid food after midnight and no clear liquids after 7am on Monday. Patient told to only take his norvasc the morning of the procedure. Told to take a laxative on the Sunday before.  RN went over all allergies, medications and medical history with patient. Patient made aware he needs someone to be with him for 24 hours after procedure. His wife, Tony Phillips will be his driver.

## 2023-02-09 ENCOUNTER — Encounter (HOSPITAL_BASED_OUTPATIENT_CLINIC_OR_DEPARTMENT_OTHER): Payer: Self-pay | Admitting: Anesthesiology

## 2023-02-09 ENCOUNTER — Ambulatory Visit (HOSPITAL_BASED_OUTPATIENT_CLINIC_OR_DEPARTMENT_OTHER)
Admission: RE | Admit: 2023-02-09 | Discharge: 2023-02-09 | Disposition: A | Discharge status: Home / Self Care | Payer: Medicare HMO | Attending: Urology | Admitting: Urology

## 2023-02-09 ENCOUNTER — Encounter (HOSPITAL_BASED_OUTPATIENT_CLINIC_OR_DEPARTMENT_OTHER): Payer: Self-pay | Admitting: Urology

## 2023-02-09 ENCOUNTER — Ambulatory Visit (HOSPITAL_COMMUNITY): Payer: Medicare HMO

## 2023-02-09 ENCOUNTER — Other Ambulatory Visit: Payer: Self-pay

## 2023-02-09 ENCOUNTER — Other Ambulatory Visit: Payer: Self-pay | Admitting: Urology

## 2023-02-09 ENCOUNTER — Encounter (HOSPITAL_BASED_OUTPATIENT_CLINIC_OR_DEPARTMENT_OTHER)
Admission: RE | Disposition: A | Discharge status: Home / Self Care | Payer: Self-pay | Source: Home / Self Care | Attending: Urology

## 2023-02-09 DIAGNOSIS — N201 Calculus of ureter: Secondary | ICD-10-CM | POA: Diagnosis not present

## 2023-02-09 DIAGNOSIS — N200 Calculus of kidney: Secondary | ICD-10-CM

## 2023-02-09 DIAGNOSIS — N202 Calculus of kidney with calculus of ureter: Secondary | ICD-10-CM | POA: Diagnosis not present

## 2023-02-09 DIAGNOSIS — I10 Essential (primary) hypertension: Secondary | ICD-10-CM | POA: Diagnosis not present

## 2023-02-09 DIAGNOSIS — Z87442 Personal history of urinary calculi: Secondary | ICD-10-CM | POA: Diagnosis not present

## 2023-02-09 HISTORY — PX: EXTRACORPOREAL SHOCK WAVE LITHOTRIPSY: SHX1557

## 2023-02-09 SURGERY — LITHOTRIPSY, ESWL
Anesthesia: LOCAL | Laterality: Left

## 2023-02-09 MED ORDER — LEVOFLOXACIN 500 MG PO TABS
500.0000 mg | ORAL_TABLET | ORAL | Status: AC
  Administered 2023-02-09: 500 mg via ORAL
  Filled 2023-02-09: qty 1

## 2023-02-09 MED ORDER — SODIUM CHLORIDE 0.9 % IV SOLN: INTRAVENOUS | Status: DC

## 2023-02-09 MED ORDER — DIAZEPAM 5 MG PO TABS
ORAL_TABLET | ORAL | Status: AC
  Filled 2023-02-09: qty 2

## 2023-02-09 MED ORDER — DIPHENHYDRAMINE HCL 25 MG PO CAPS
ORAL_CAPSULE | ORAL | Status: AC
  Filled 2023-02-09: qty 1

## 2023-02-09 MED ORDER — CIPROFLOXACIN HCL 500 MG PO TABS
ORAL_TABLET | ORAL | Status: AC
  Filled 2023-02-09: qty 1

## 2023-02-09 MED ORDER — DIAZEPAM 5 MG PO TABS
10.0000 mg | ORAL_TABLET | ORAL | Status: AC
  Administered 2023-02-09: 10 mg via ORAL

## 2023-02-09 MED ORDER — DIPHENHYDRAMINE HCL 25 MG PO CAPS
25.0000 mg | ORAL_CAPSULE | ORAL | Status: AC
  Administered 2023-02-09: 25 mg via ORAL

## 2023-02-09 NOTE — Interval H&P Note (Signed)
History and Physical Interval Note:  02/09/2023 2:51 PM  Tony Phillips  has presented today for surgery, with the diagnosis of ureteral calculus.  The various methods of treatment have been discussed with the patient and family. After consideration of risks, benefits and other options for treatment, the patient has consented to  Procedure(s): EXTRACORPOREAL SHOCK WAVE LITHOTRIPSY (ESWL) (Left) as a surgical intervention.  The patient's history has been reviewed, patient examined, no change in status, stable for surgery.  I have reviewed the patient's chart and labs.  Questions were answered to the patient's satisfaction.     Joline Maxcy

## 2023-02-10 ENCOUNTER — Encounter (HOSPITAL_BASED_OUTPATIENT_CLINIC_OR_DEPARTMENT_OTHER): Payer: Self-pay | Admitting: Urology

## 2023-02-17 ENCOUNTER — Ambulatory Visit (HOSPITAL_BASED_OUTPATIENT_CLINIC_OR_DEPARTMENT_OTHER)
Admission: RE | Admit: 2023-02-17 | Discharge: 2023-02-17 | Disposition: A | Discharge status: Home / Self Care | Payer: Medicare HMO | Source: Ambulatory Visit | Attending: Urology | Admitting: Urology

## 2023-02-17 ENCOUNTER — Ambulatory Visit (INDEPENDENT_AMBULATORY_CARE_PROVIDER_SITE_OTHER): Payer: Medicare HMO | Admitting: Urology

## 2023-02-17 ENCOUNTER — Other Ambulatory Visit: Payer: Self-pay | Admitting: Urology

## 2023-02-17 ENCOUNTER — Encounter: Payer: Self-pay | Admitting: Urology

## 2023-02-17 VITALS — BP 172/77 | HR 49

## 2023-02-17 DIAGNOSIS — Z9889 Other specified postprocedural states: Secondary | ICD-10-CM | POA: Diagnosis not present

## 2023-02-17 DIAGNOSIS — N200 Calculus of kidney: Secondary | ICD-10-CM

## 2023-02-17 NOTE — Progress Notes (Signed)
   Assessment: 1. Nephrolithiasis     Plan: Discussed with patient today that he still has substantial fragments that need to pass.  He admits that he has not been taking the tamsulosin Phillips I recommend that he restart taking that.  Also encouraged to force fluids Phillips stay well-hydrated. Will follow-up in approximately 10 days with a repeat KUB Phillips assessment.  Chief Complaint: Kidney stone  HPI: Tony HELLENBRAND is a 67 y.o. male who presents for continued evaluation of nephrolithiasis. Patient is status post ESWL 01/30/2023 for an 11 mm left renal pelvic stone. Patient reports doing very well.  He denies any significant flank pain.  He has passed a few fragments.  On KUB it appears that the patient has several fragments localized in the mid ureter Phillips some small fragments in the lower pole of the kidney.  Portions of the above documentation were copied from a prior visit for review purposes only.  Allergies: No Known Allergies  PMH: Past Medical History:  Diagnosis Date   Hypertension    Kidney stone     PSH: Past Surgical History:  Procedure Laterality Date   COLONOSCOPY N/A 12/21/2018   Procedure: COLONOSCOPY;  Surgeon: Corbin Ade, MD;  [facility];  Service: Endoscopy;  Laterality: N/A;  8:30   EXTRACORPOREAL SHOCK WAVE LITHOTRIPSY Left 02/09/2023   Procedure: EXTRACORPOREAL SHOCK WAVE LITHOTRIPSY (ESWL);  Surgeon: Joline Maxcy, MD;  Location[facility];  Service: Urology;  Laterality: Left;   KNEE ARTHROSCOPY     POLYPECTOMY  12/21/2018   Procedure: POLYPECTOMY;  Surgeon: Corbin Ade, MD;  [facility]: [facility];  Service: Endoscopy;;    SH: Social History   Tobacco Use   Smoking status: Never   Smokeless tobacco: Never  Vaping Use   Vaping status: Never Used  Substance Use Topics   Alcohol use: No   Drug use: No    ROS: Constitutional:  Negative for fever, chills, weight loss CV: Negative for chest pain, previous  MI, hypertension Respiratory:  Negative for shortness of breath, wheezing, sleep apnea, frequent cough GI:  Negative for nausea, vomiting, bloody stool, GERD  PE: There were no vitals taken for this visit. GENERAL APPEARANCE:  Well appearing, well developed, well nourished, NAD    Results: UA is negative except for 3-10 RBCs/hpf

## 2023-02-20 DIAGNOSIS — H35351 Cystoid macular degeneration, right eye: Secondary | ICD-10-CM | POA: Diagnosis not present

## 2023-02-26 ENCOUNTER — Ambulatory Visit: Payer: Medicare HMO | Admitting: Urology

## 2023-03-09 ENCOUNTER — Other Ambulatory Visit: Payer: Self-pay

## 2023-03-09 DIAGNOSIS — N200 Calculus of kidney: Secondary | ICD-10-CM

## 2023-03-10 ENCOUNTER — Telehealth (HOSPITAL_BASED_OUTPATIENT_CLINIC_OR_DEPARTMENT_OTHER): Payer: Self-pay

## 2023-03-10 ENCOUNTER — Ambulatory Visit: Payer: Medicare HMO | Admitting: Urology

## 2023-03-10 ENCOUNTER — Ambulatory Visit (HOSPITAL_BASED_OUTPATIENT_CLINIC_OR_DEPARTMENT_OTHER)
Admission: RE | Admit: 2023-03-10 | Discharge: 2023-03-10 | Disposition: A | Discharge status: Home / Self Care | Payer: Medicare HMO | Source: Ambulatory Visit | Attending: Urology | Admitting: Urology

## 2023-03-10 ENCOUNTER — Encounter: Payer: Self-pay | Admitting: Urology

## 2023-03-10 VITALS — BP 176/96 | HR 56 | Ht 72.0 in | Wt 250.0 lb

## 2023-03-10 DIAGNOSIS — N200 Calculus of kidney: Secondary | ICD-10-CM | POA: Insufficient documentation

## 2023-03-10 DIAGNOSIS — N2889 Other specified disorders of kidney and ureter: Secondary | ICD-10-CM | POA: Diagnosis not present

## 2023-03-10 DIAGNOSIS — N529 Male erectile dysfunction, unspecified: Secondary | ICD-10-CM

## 2023-03-10 DIAGNOSIS — Z87442 Personal history of urinary calculi: Secondary | ICD-10-CM

## 2023-03-10 MED ORDER — TADALAFIL 20 MG PO TABS: 20.0000 mg | ORAL_TABLET | Freq: Every day | ORAL | 5 refills | Status: AC | PRN

## 2023-03-10 NOTE — Progress Notes (Signed)
   Assessment: 1. Nephrolithiasis   2. Erectile dysfunction of organic origin     Plan: Patient will continue tamsulosin, hydration and straining his urine. Follow-up 2 weeks with CT stone study prior to visit.  If he has residual ureteral calculi at that time would recommend endoscopic intervention.  Concerning his ED we discussed additional medical management options.  Patient would like to try alternative medical therapy. Rx: Cialis 20 mg as needed  Chief Complaint: Chief Complaint  Patient presents with   Nephrolithiasis    HPI: Patient presents for follow-up today regarding nephrolithiasis. Patient is status post ESWL 01/30/2023 for an 11 mm left renal pelvic stone. Patient reports doing very well.  He denies any significant flank pain.  He has passed a few fragments.   On KUB today it appears that he has passed several of the ureteral fragments.  Still may have one residual fragment mid ureter.  Also reports that today that he has not happy with his sildenafil that he has been prescribed for ED and would like to try an alternative medication.   Portions of the above documentation were copied from a prior visit for review purposes only.  Allergies: No Known Allergies  PMH: Past Medical History:  Diagnosis Date   Hypertension    Kidney stone     PSH: Past Surgical History:  Procedure Laterality Date   COLONOSCOPY N/A 12/21/2018   Procedure: COLONOSCOPY;  Surgeon: Corbin Ade, MD;  [facility];  Service: Endoscopy;  Laterality: N/A;  8:30   EXTRACORPOREAL SHOCK WAVE LITHOTRIPSY Left 02/09/2023   Procedure: EXTRACORPOREAL SHOCK WAVE LITHOTRIPSY (ESWL);  Surgeon: Joline Maxcy, MD;  Location: [facility];  Service: Urology;  Laterality: Left;   KNEE ARTHROSCOPY     POLYPECTOMY  12/21/2018   Procedure: POLYPECTOMY;  Surgeon: Corbin Ade, MD;  [facility];  Service: Endoscopy;;    SH: Social History   Tobacco Use    Smoking status: Never   Smokeless tobacco: Never  Vaping Use   Vaping status: Never Used  Substance Use Topics   Alcohol use: No   Drug use: No    ROS: Constitutional:  Negative for fever, chills, weight loss CV: Negative for chest pain, previous MI, hypertension Respiratory:  Negative for shortness of breath, wheezing, sleep apnea, frequent cough GI:  Negative for nausea, vomiting, bloody stool, GERD  PE: BP (!) 176/96   Pulse (!) 56   Ht 6' (1.829 m)   Wt 250 lb (113.4 kg)   BMI 33.91 kg/m  GENERAL APPEARANCE:  Well appearing, well developed, well nourished, NAD    Results: Urinalysis is negative for UTI but does show 3-10 RBC/hpf

## 2023-03-12 ENCOUNTER — Other Ambulatory Visit: Payer: Self-pay | Admitting: Urology

## 2023-03-13 DIAGNOSIS — U071 COVID-19: Secondary | ICD-10-CM | POA: Diagnosis not present

## 2023-03-13 DIAGNOSIS — I10 Essential (primary) hypertension: Secondary | ICD-10-CM | POA: Diagnosis not present

## 2023-03-20 ENCOUNTER — Telehealth: Payer: Self-pay | Admitting: Urology

## 2023-03-20 LAB — URINALYSIS, ROUTINE W REFLEX MICROSCOPIC
Bilirubin, UA: NEGATIVE
Glucose, UA: NEGATIVE
Ketones, UA: NEGATIVE
Leukocytes,UA: NEGATIVE
Nitrite, UA: NEGATIVE
Protein,UA: NEGATIVE
Specific Gravity, UA: 1.02 (ref 1.005–1.030)
Urobilinogen, Ur: 1 mg/dL (ref 0.2–1.0)
pH, UA: 6 (ref 5.0–7.5)

## 2023-03-20 LAB — MICROSCOPIC EXAMINATION

## 2023-03-20 NOTE — Telephone Encounter (Signed)
Wants to know if he needs a KUB on day of the appt.

## 2023-03-20 NOTE — Telephone Encounter (Signed)
Attempted to reach patient- VM full 

## 2023-03-23 NOTE — Telephone Encounter (Signed)
Spoke with patient, he is aware that he is to have a CT prior to his f/u appt. Appt for 9/10 canceled and pt will reschedule after he gets his CT scheduled.   Left msg with imaging dept that pt is waiting to get scheduled.

## 2023-03-24 ENCOUNTER — Ambulatory Visit: Payer: Medicare HMO | Admitting: Urology

## 2023-03-27 ENCOUNTER — Ambulatory Visit (HOSPITAL_BASED_OUTPATIENT_CLINIC_OR_DEPARTMENT_OTHER)
Admission: RE | Admit: 2023-03-27 | Discharge: 2023-03-27 | Disposition: A | Discharge status: Home / Self Care | Payer: Medicare HMO | Source: Ambulatory Visit | Attending: Urology | Admitting: Urology

## 2023-03-27 DIAGNOSIS — N202 Calculus of kidney with calculus of ureter: Secondary | ICD-10-CM | POA: Diagnosis not present

## 2023-03-27 DIAGNOSIS — Z9049 Acquired absence of other specified parts of digestive tract: Secondary | ICD-10-CM | POA: Diagnosis not present

## 2023-03-27 DIAGNOSIS — N200 Calculus of kidney: Secondary | ICD-10-CM | POA: Insufficient documentation

## 2023-03-27 DIAGNOSIS — N281 Cyst of kidney, acquired: Secondary | ICD-10-CM | POA: Diagnosis not present

## 2023-04-15 ENCOUNTER — Ambulatory Visit: Payer: Medicare HMO | Admitting: Urology

## 2023-04-15 ENCOUNTER — Encounter: Payer: Self-pay | Admitting: Urology

## 2023-04-15 ENCOUNTER — Ambulatory Visit: Payer: Self-pay | Admitting: Urology

## 2023-04-15 VITALS — BP 170/90 | HR 54 | Ht 72.0 in | Wt 250.0 lb

## 2023-04-15 DIAGNOSIS — N200 Calculus of kidney: Secondary | ICD-10-CM

## 2023-04-15 LAB — URINALYSIS, ROUTINE W REFLEX MICROSCOPIC
Bilirubin, UA: NEGATIVE
Glucose, UA: NEGATIVE
Ketones, UA: NEGATIVE
Leukocytes,UA: NEGATIVE
Nitrite, UA: NEGATIVE
Protein,UA: NEGATIVE
Specific Gravity, UA: 1.025 (ref 1.005–1.030)
Urobilinogen, Ur: 2 mg/dL — ABNORMAL HIGH (ref 0.2–1.0)
pH, UA: 5.5 (ref 5.0–7.5)

## 2023-04-15 LAB — MICROSCOPIC EXAMINATION
Epithelial Cells (non renal): NONE SEEN /[HPF] (ref 0–10)
WBC, UA: NONE SEEN /[HPF] (ref 0–5)

## 2023-04-15 NOTE — Progress Notes (Signed)
   Assessment: 1. Nephrolithiasis     Plan: Today I had a long discussion with the patient and his wife regarding his post ESWL Steinstrasse and recommendations for left ureteroscopy with laser lithotripsy and placement of a ureteral stent.  Nature procedure reviewed in detail today and all questions answered.  He agrees to proceed.  Chief Complaint: Kidney stone  HPI: Tony Phillips is a 67 y.o. male who presents for continued evaluation of nephrolithiasis.  Patient continues to do well clinically.  He has had no recent flank pain or hematuria. Patient is status post ESWL 01/30/2023 for an 11 mm left renal pelvic stone. Patient reports doing very well.  He denies any significant flank pain.  He has passed a few fragments.   CT stone study 03/27/2023-- IMPRESSION: Two nonobstructing left renal calculi measuring up to 13 mm in the lower pole. Stacked calculi measuring up to 16 mm in aggregate in the distal left ureter just above the UVJ. Additional punctate left ureteral calculus at the UVJ.   No hydronephrosis.   Portions of the above documentation were copied from a prior visit for review purposes only.  Allergies: No Known Allergies  PMH: Past Medical History:  Diagnosis Date   Hypertension    Kidney stone     PSH: Past Surgical History:  Procedure Laterality Date   COLONOSCOPY N/A 12/21/2018   Procedure: COLONOSCOPY;  Surgeon: Corbin Ade, MD;  [facility];  Service: Endoscopy;  Laterality: N/A;  8:30   EXTRACORPOREAL SHOCK WAVE LITHOTRIPSY Left 02/09/2023   Procedure: EXTRACORPOREAL SHOCK WAVE LITHOTRIPSY (ESWL);  Surgeon: Joline Maxcy, MD;  Location: [facility];  Service: Urology;  Laterality: Left;   KNEE ARTHROSCOPY     POLYPECTOMY  12/21/2018   Procedure: POLYPECTOMY;  Surgeon: Corbin Ade, MD;  [facility];  Service: Endoscopy;;    SH: Social History   Tobacco Use   Smoking status: Never   Smokeless  tobacco: Never  Vaping Use   Vaping status: Never Used  Substance Use Topics   Alcohol use: No   Drug use: No    ROS: Constitutional:  Negative for fever, chills, weight loss CV: Negative for chest pain, previous MI, hypertension Respiratory:  Negative for shortness of breath, wheezing, sleep apnea, frequent cough GI:  Negative for nausea, vomiting, bloody stool, GERD  PE: BP (!) 170/90   Pulse (!) 54   Ht 6' (1.829 m)   Wt 250 lb (113.4 kg)   BMI 33.91 kg/m  GENERAL APPEARANCE:  Well appearing, well developed, well nourished, NAD HEENT:  Atraumatic, normocephalic, oropharynx clear COR:  RR LUNGS:  CTA ABDOMEN:  Soft, non-tender, no masses EXTREMITIES:  Moves all extremities well, without clubbing, cyanosis, or edema NEUROLOGIC:  Alert and oriented x 3, normal gait, CN II-XII grossly intact MENTAL STATUS:  appropriate BACK:  Non-tender to palpation, No CVAT SKIN:  Warm, dry, and intact   Results: UA neg for uti

## 2023-04-17 LAB — MICROSCOPIC EXAMINATION
Epithelial Cells (non renal): NONE SEEN /[HPF] (ref 0–10)
WBC, UA: NONE SEEN /[HPF] (ref 0–5)

## 2023-04-17 LAB — URINALYSIS, ROUTINE W REFLEX MICROSCOPIC
Bilirubin, UA: NEGATIVE
Glucose, UA: NEGATIVE
Ketones, UA: NEGATIVE
Leukocytes,UA: NEGATIVE
Nitrite, UA: NEGATIVE
Protein,UA: NEGATIVE
Specific Gravity, UA: >1.03 — ABNORMAL HIGH (ref 1.005–1.030)
Urobilinogen, Ur: 2 mg/dL — ABNORMAL HIGH (ref 0.2–1.0)
pH, UA: 5.5 (ref 5.0–7.5)

## 2023-04-20 ENCOUNTER — Encounter (HOSPITAL_BASED_OUTPATIENT_CLINIC_OR_DEPARTMENT_OTHER): Payer: Self-pay | Admitting: Urology

## 2023-04-20 ENCOUNTER — Other Ambulatory Visit: Payer: Self-pay

## 2023-04-20 NOTE — Progress Notes (Signed)
Spoke w/ via phone for pre-op interview---pt Lab needs dos---- I stat, ekg        Lab results------ COVID test -----patient states asymptomatic no test needed Arrive at -------930 04-28-2023 NPO after MN NO Solid Food.  Clear liquids from MN until---830 Med rec completed Medications to take morning of surgery -----tamsulosin, amlodipine Diabetic medication -----n/a Patient instructed no nail polish to be worn day of surgery Patient instructed to bring photo id and insurance card day of surgery Patient aware to have Driver (ride ) / caregiver    for 24 hours after surgery -  Tony Phillips wife Patient Special Instructions -----none Pre-Op special Instructions -----none Patient verbalized understanding of instructions that were given at this phone interview. Patient denies chest pain, sob, fever, cough at the interview.

## 2023-04-22 ENCOUNTER — Other Ambulatory Visit: Payer: Self-pay | Admitting: Urology

## 2023-04-22 ENCOUNTER — Telehealth: Payer: Self-pay | Admitting: Urology

## 2023-04-22 MED ORDER — TAMSULOSIN HCL 0.4 MG PO CAPS: 0.4000 mg | ORAL_CAPSULE | Freq: Every day | ORAL | 0 refills | Status: DC

## 2023-04-22 NOTE — Telephone Encounter (Signed)
Patient is scheduled for surgery on 10/15 and was checking on the status of a prescription that was supposed to have been sent to his pharmacy for his kidney stone.

## 2023-04-22 NOTE — Telephone Encounter (Signed)
Left msg that prescription was sent to pharmacy.  

## 2023-04-23 ENCOUNTER — Telehealth: Payer: Self-pay | Admitting: Urology

## 2023-04-23 ENCOUNTER — Other Ambulatory Visit: Payer: Self-pay | Admitting: Urology

## 2023-04-23 MED ORDER — ONDANSETRON 4 MG PO TBDP: 4.0000 mg | ORAL_TABLET | Freq: Three times a day (TID) | ORAL | 0 refills | Status: AC | PRN

## 2023-04-23 NOTE — Telephone Encounter (Signed)
Patient was throwing up all night and in pain. Wants to know if something could be prescribed to him to help until his surgery on 10/15  I sent this message to dr hall and christal. And then realized christal was not here. Sending twice to make sure Crysta got the message.

## 2023-04-23 NOTE — Telephone Encounter (Signed)
Patient was throwing up and in pain overnight last night. Was wondering if something could be prescribed to him for some type of relief until his up coming surgery on 10/15

## 2023-04-24 NOTE — Telephone Encounter (Signed)
Spoke with patient, he reports passing 5-6 stones and feels much better today.

## 2023-04-27 ENCOUNTER — Telehealth: Payer: Self-pay

## 2023-04-27 ENCOUNTER — Telehealth: Payer: Self-pay | Admitting: Urology

## 2023-04-27 DIAGNOSIS — N200 Calculus of kidney: Secondary | ICD-10-CM

## 2023-04-27 NOTE — Telephone Encounter (Signed)
Received a call from our office. Thinks it might be regarding the patients surgery for  tomorrow.

## 2023-04-27 NOTE — Telephone Encounter (Signed)
Spoke with wife, she will let pt know that he needs to come in today for a KUB prior to the procedure tomorrow. Pt reported last week that he had a bad night and passed 5-6 stones.

## 2023-04-27 NOTE — Telephone Encounter (Signed)
Spoke with wife, surgery is being cancelled for tomorrow per Dr. Margo Aye. They are going to keep the stones to bring in to his next appt.   When should he follow up?

## 2023-04-28 ENCOUNTER — Ambulatory Visit (HOSPITAL_BASED_OUTPATIENT_CLINIC_OR_DEPARTMENT_OTHER): Admission: RE | Admit: 2023-04-28 | Payer: Medicare HMO | Source: Home / Self Care | Admitting: Urology

## 2023-04-28 DIAGNOSIS — Z01818 Encounter for other preprocedural examination: Secondary | ICD-10-CM

## 2023-04-28 HISTORY — DX: Other complications of anesthesia, initial encounter: T88.59XA

## 2023-04-28 SURGERY — CYSTOURETEROSCOPY, WITH RETROGRADE PYELOGRAM AND STENT INSERTION
Anesthesia: General | Laterality: Left

## 2023-05-28 NOTE — Progress Notes (Deleted)
   Assessment: No diagnosis found.  Plan: ***  Chief Complaint: No chief complaint on file.   HPI: Tony Phillips is a 67 y.o. male who presents for continued evaluation of nephrolithiasis. Patient is status post left ESL for an 11 mm left UPJ stone on 02/09/2023.  Clinically the patient did well post ESL but on KUB continues to show evidence of significant residual fragments in the left ureter distally.  As the patient was quite comfortable and not having significant pain issues we elected to try and let him pass the stones.  On follow-up in September 2024 the patient on CT stone study still has significant residual stones without significant hydro-.  At that time I discussed with the patient ureteroscopic laser lithotripsy to clear his Steinstrasse.  A few days prior to the procedure however the patient developed significant flank pain nausea and passed multiple stones.  We elected to cancel the ureteroscopy.  Portions of the above documentation were copied from a prior visit for review purposes only.  Allergies: No Known Allergies  PMH: Past Medical History:  Diagnosis Date   Complication of anesthesia    heart rate went down to the 40's with colonscopy in 2020, normal heart rate is in the 50's   Hypertension    Kidney stone     PSH: Past Surgical History:  Procedure Laterality Date   COLONOSCOPY N/A 12/21/2018   Procedure: COLONOSCOPY;  Surgeon: Corbin Ade, MD;  [facility];  Service: Endoscopy;  Laterality: N/A;  8:30   EXTRACORPOREAL SHOCK WAVE LITHOTRIPSY Left 02/09/2023   Procedure: EXTRACORPOREAL SHOCK WAVE LITHOTRIPSY (ESWL);  Surgeon: Joline Maxcy, MD;  Location: [facility];  Service: Urology;  Laterality: Left;   KNEE ARTHROSCOPY Right    yrs ago   left foot surgery     1980's   POLYPECTOMY  12/21/2018   Procedure: POLYPECTOMY;  Surgeon: Corbin Ade, MD;  [facility];  Service: Endoscopy;;    SH: Social  History   Tobacco Use   Smoking status: Never   Smokeless tobacco: Never  Vaping Use   Vaping status: Never Used  Substance Use Topics   Alcohol use: No   Drug use: No    ROS: Constitutional:  Negative for fever, chills, weight loss CV: Negative for chest pain, previous MI, hypertension Respiratory:  Negative for shortness of breath, wheezing, sleep apnea, frequent cough GI:  Negative for nausea, vomiting, bloody stool, GERD  PE: There were no vitals taken for this visit. GENERAL APPEARANCE:  Well appearing, well developed, well nourished, NAD HEENT:  Atraumatic, normocephalic, oropharynx clear NECK:  Supple without lymphadenopathy or thyromegaly ABDOMEN:  Soft, non-tender, no masses EXTREMITIES:  Moves all extremities well, without clubbing, cyanosis, or edema NEUROLOGIC:  Alert and oriented x 3, normal gait, CN II-XII grossly intact MENTAL STATUS:  appropriate BACK:  Non-tender to palpation, No CVAT SKIN:  Warm, dry, and intact   Results: No results found for this or any previous visit (from the past 24 hour(s)).

## 2023-05-29 ENCOUNTER — Ambulatory Visit: Payer: Medicare HMO | Admitting: Urology

## 2023-05-29 DIAGNOSIS — N200 Calculus of kidney: Secondary | ICD-10-CM

## 2023-07-22 DIAGNOSIS — H3091 Unspecified chorioretinal inflammation, right eye: Secondary | ICD-10-CM | POA: Diagnosis not present

## 2023-07-22 DIAGNOSIS — H35351 Cystoid macular degeneration, right eye: Secondary | ICD-10-CM | POA: Diagnosis not present

## 2023-08-06 DIAGNOSIS — F419 Anxiety disorder, unspecified: Secondary | ICD-10-CM | POA: Diagnosis not present

## 2023-08-06 DIAGNOSIS — J029 Acute pharyngitis, unspecified: Secondary | ICD-10-CM | POA: Diagnosis not present

## 2023-08-06 DIAGNOSIS — E6609 Other obesity due to excess calories: Secondary | ICD-10-CM | POA: Diagnosis not present

## 2023-08-06 DIAGNOSIS — Z6835 Body mass index (BMI) 35.0-35.9, adult: Secondary | ICD-10-CM | POA: Diagnosis not present

## 2023-09-08 DIAGNOSIS — Z6835 Body mass index (BMI) 35.0-35.9, adult: Secondary | ICD-10-CM | POA: Diagnosis not present

## 2023-09-08 DIAGNOSIS — J069 Acute upper respiratory infection, unspecified: Secondary | ICD-10-CM | POA: Diagnosis not present

## 2023-09-08 DIAGNOSIS — Z20828 Contact with and (suspected) exposure to other viral communicable diseases: Secondary | ICD-10-CM | POA: Diagnosis not present

## 2023-09-08 DIAGNOSIS — E6609 Other obesity due to excess calories: Secondary | ICD-10-CM | POA: Diagnosis not present

## 2023-09-08 DIAGNOSIS — F419 Anxiety disorder, unspecified: Secondary | ICD-10-CM | POA: Diagnosis not present

## 2023-09-08 DIAGNOSIS — R6889 Other general symptoms and signs: Secondary | ICD-10-CM | POA: Diagnosis not present

## 2023-10-06 DIAGNOSIS — K635 Polyp of colon: Secondary | ICD-10-CM | POA: Diagnosis not present

## 2023-10-06 DIAGNOSIS — Z0001 Encounter for general adult medical examination with abnormal findings: Secondary | ICD-10-CM | POA: Diagnosis not present

## 2023-10-06 DIAGNOSIS — Z1331 Encounter for screening for depression: Secondary | ICD-10-CM | POA: Diagnosis not present

## 2023-10-06 DIAGNOSIS — I10 Essential (primary) hypertension: Secondary | ICD-10-CM | POA: Diagnosis not present

## 2023-10-06 DIAGNOSIS — R7309 Other abnormal glucose: Secondary | ICD-10-CM | POA: Diagnosis not present

## 2023-10-06 DIAGNOSIS — E6609 Other obesity due to excess calories: Secondary | ICD-10-CM | POA: Diagnosis not present

## 2023-10-06 DIAGNOSIS — Z125 Encounter for screening for malignant neoplasm of prostate: Secondary | ICD-10-CM | POA: Diagnosis not present

## 2023-10-06 DIAGNOSIS — F419 Anxiety disorder, unspecified: Secondary | ICD-10-CM | POA: Diagnosis not present

## 2023-10-06 DIAGNOSIS — E782 Mixed hyperlipidemia: Secondary | ICD-10-CM | POA: Diagnosis not present

## 2023-10-06 DIAGNOSIS — Z6835 Body mass index (BMI) 35.0-35.9, adult: Secondary | ICD-10-CM | POA: Diagnosis not present

## 2023-10-06 DIAGNOSIS — J302 Other seasonal allergic rhinitis: Secondary | ICD-10-CM | POA: Diagnosis not present

## 2023-11-25 ENCOUNTER — Encounter: Payer: Self-pay | Admitting: *Deleted

## 2023-12-17 ENCOUNTER — Encounter (INDEPENDENT_AMBULATORY_CARE_PROVIDER_SITE_OTHER): Payer: Self-pay | Admitting: *Deleted

## 2024-01-20 ENCOUNTER — Other Ambulatory Visit: Payer: Self-pay

## 2024-01-20 ENCOUNTER — Ambulatory Visit: Admitting: Urology

## 2024-01-20 ENCOUNTER — Telehealth: Payer: Self-pay

## 2024-01-20 ENCOUNTER — Ambulatory Visit (HOSPITAL_BASED_OUTPATIENT_CLINIC_OR_DEPARTMENT_OTHER)
Admission: RE | Admit: 2024-01-20 | Discharge: 2024-01-20 | Disposition: A | Discharge status: Home / Self Care | Source: Ambulatory Visit | Attending: Urology | Admitting: Urology

## 2024-01-20 VITALS — BP 186/95 | HR 61 | Ht 72.0 in | Wt 255.0 lb

## 2024-01-20 DIAGNOSIS — R109 Unspecified abdominal pain: Secondary | ICD-10-CM | POA: Diagnosis not present

## 2024-01-20 DIAGNOSIS — I878 Other specified disorders of veins: Secondary | ICD-10-CM | POA: Diagnosis not present

## 2024-01-20 DIAGNOSIS — N200 Calculus of kidney: Secondary | ICD-10-CM

## 2024-01-20 DIAGNOSIS — Z9049 Acquired absence of other specified parts of digestive tract: Secondary | ICD-10-CM | POA: Diagnosis not present

## 2024-01-20 LAB — URINALYSIS, ROUTINE W REFLEX MICROSCOPIC
Bilirubin, UA: NEGATIVE
Glucose, UA: NEGATIVE
Ketones, UA: NEGATIVE
Nitrite, UA: NEGATIVE
Specific Gravity, UA: 1.02 (ref 1.005–1.030)
Urobilinogen, Ur: 1 mg/dL (ref 0.2–1.0)
pH, UA: 5.5 (ref 5.0–7.5)

## 2024-01-20 LAB — MICROSCOPIC EXAMINATION

## 2024-01-20 MED ORDER — TAMSULOSIN HCL 0.4 MG PO CAPS: 0.4000 mg | ORAL_CAPSULE | Freq: Every day | ORAL | 0 refills | Status: AC

## 2024-01-20 NOTE — Telephone Encounter (Signed)
-----   Message from Garnette HERO Dahlstedt sent at 01/20/2024 12:38 PM EDT ----- Please call patient-even though the radiologist does not see it, there is a stone that is about 6 mm wide about half the way down to his bladder.  It is a decent size, and it may not pass on its own.  However, I am fine with giving him some time, with the Flomax  that I gave him, to see if it moves farther south/into the bladder.  If he wants to follow-up, have him see me here in the next week or 2 with a KUB.  If he wants to proceed with lithotripsy, we can work on setting that up as well.

## 2024-01-20 NOTE — Progress Notes (Signed)
 H&P    History of Present Illness: 68 year old male with prior history of urolithiasis, just about a year out from shockwave lithotripsy by Dr. Shona, presents with a 1 day history of left flank pain reminiscent of prior kidney stone pain.  No fever but he has had nausea.  Increased urinary frequency, darker urine.  Past Medical History:  Diagnosis Date   Complication of anesthesia    heart rate went down to the 40's with colonscopy in 2020, normal heart rate is in the 50's   Hypertension    Kidney stone     Past Surgical History:  Procedure Laterality Date   COLONOSCOPY N/A 12/21/2018   Procedure: COLONOSCOPY;  Surgeon: Shaaron Lamar HERO, MD;  Location: [facility];  Service: Endoscopy;  Laterality: N/A;  8:30   EXTRACORPOREAL SHOCK WAVE LITHOTRIPSY Left 02/09/2023   Procedure: EXTRACORPOREAL SHOCK WAVE LITHOTRIPSY (ESWL);  Surgeon: Shona Layman BROCKS, MD;  Location: [facility];  Service: Urology;  Laterality: Left;   KNEE ARTHROSCOPY Right    yrs ago   left foot surgery     1980's   POLYPECTOMY  12/21/2018   Procedure: POLYPECTOMY;  Surgeon: Shaaron Lamar HERO, MD;  Location: [facility];  Service: Endoscopy;;    Home Medications:  Allergies as of 01/20/2024   No Known Allergies      Medication List        Accurate as of January 20, 2024  9:18 AM. If you have any questions, ask your nurse or doctor.          amLODipine  10 MG tablet Commonly known as: NORVASC  Take 10 mg by mouth daily.   bisoprolol-hydrochlorothiazide 5-6.25 MG tablet Commonly known as: ZIAC Take 1 tablet by mouth daily.   ondansetron  4 MG disintegrating tablet Commonly known as: ZOFRAN -ODT Take 1 tablet (4 mg total) by mouth every 8 (eight) hours as needed for nausea or vomiting.   tadalafil  20 MG tablet Commonly known as: CIALIS  Take 1 tablet (20 mg total) by mouth daily as needed for erectile dysfunction.   tamsulosin  0.4 MG Caps capsule Commonly known as: FLOMAX  Take 1  capsule (0.4 mg total) by mouth daily after supper.        Allergies: No Known Allergies  No family history on file.  Social History:  reports that he has never smoked. He has never used smokeless tobacco. He reports that he does not drink alcohol and does not use drugs.  ROS: A complete review of systems was performed.  All systems are negative except for pertinent findings as noted.  Physical Exam:  Vital signs in last 24 hours: BP (!) 186/95   Pulse 61   Ht 6' (1.829 m)   Wt 255 lb (115.7 kg)   BMI 34.58 kg/m  Constitutional:  Alert and oriented, No acute distress Cardiovascular: Regular rate  Respiratory: Normal respiratory effort GI: Abdomen protuberant, soft.  Minimal left lower quadrant tenderness.  Mild left CVA tenderness. Lymphatic: No lymphadenopathy Neurologic: Grossly intact, no focal deficits Psychiatric: Normal mood and affect  I have reviewed prior pt notes  I have reviewed notes from referring/previous physicians  I have reviewed urinalysis results--microscopic hematuria  I have independently reviewed prior imaging--CT scan images from September 2024 reviewed.  He had left distal ureteral Steinstrasse as well as a fairly large size stone burden in the left lower pole.     Impression/Assessment:  Probable left ureteral stone  Plan:  -I will send him down for a KUB  today, he can go home after that  -We will notify patient of results as well as further management

## 2024-01-20 NOTE — Telephone Encounter (Signed)
 Spoke with pt in reference to KUB results and stone location. Pt requested to see Dr. Matilda next week. Appt made. Made aware will need KUB prior. Pt voiced understanding.

## 2024-01-24 NOTE — Progress Notes (Signed)
 Impression/Assessment:  Probable left ureteral stone  Plan:  -I will send him down for a KUB today, he can go home after that  -We will notify patient of results as well as further management    History of Present Illness:   7.9.2025: 68 year old male with prior history of urolithiasis, just about a year out from shockwave lithotripsy by Dr. Shona, presents with a 1 day history of left flank pain reminiscent of prior kidney stone pain.  No fever but he has had nausea.  Increased urinary frequency, darker urine.  Past Medical History:  Diagnosis Date   Complication of anesthesia    heart rate went down to the 40's with colonscopy in 2020, normal heart rate is in the 50's   Hypertension    Kidney stone     Past Surgical History:  Procedure Laterality Date   COLONOSCOPY N/A 12/21/2018   Procedure: COLONOSCOPY;  Surgeon: Shaaron Lamar HERO, MD;  Location: [facility];  Service: Endoscopy;  Laterality: N/A;  8:30   EXTRACORPOREAL SHOCK WAVE LITHOTRIPSY Left 02/09/2023   Procedure: EXTRACORPOREAL SHOCK WAVE LITHOTRIPSY (ESWL);  Surgeon: Shona Layman BROCKS, MD;  Location: [facility];  Service: Urology;  Laterality: Left;   KNEE ARTHROSCOPY Right    yrs ago   left foot surgery     1980's   POLYPECTOMY  12/21/2018   Procedure: POLYPECTOMY;  Surgeon: Shaaron Lamar HERO, MD;  Location: [facility];  Service: Endoscopy;;    Home Medications:  Allergies as of 01/25/2024   No Known Allergies      Medication List        Accurate as of January 24, 2024  7:46 PM. If you have any questions, ask your nurse or doctor.          amLODipine  10 MG tablet Commonly known as: NORVASC  Take 10 mg by mouth daily.   bisoprolol-hydrochlorothiazide 5-6.25 MG tablet Commonly known as: ZIAC Take 1 tablet by mouth daily.   ondansetron  4 MG disintegrating tablet Commonly known as: ZOFRAN -ODT Take 1 tablet (4 mg total) by mouth every 8 (eight) hours as needed for nausea or  vomiting.   tadalafil  20 MG tablet Commonly known as: CIALIS  Take 1 tablet (20 mg total) by mouth daily as needed for erectile dysfunction.   tamsulosin  0.4 MG Caps capsule Commonly known as: FLOMAX  Take 1 capsule (0.4 mg total) by mouth daily after supper.        Allergies: No Known Allergies  No family history on file.  Social History:  reports that he has never smoked. He has never used smokeless tobacco. He reports that he does not drink alcohol and does not use drugs.  ROS: A complete review of systems was performed.  All systems are negative except for pertinent findings as noted.  Physical Exam:  Vital signs in last 24 hours: There were no vitals taken for this visit. Constitutional:  Alert and oriented, No acute distress Cardiovascular: Regular rate  Respiratory: Normal respiratory effort GI: Abdomen protuberant, soft.  Minimal left lower quadrant tenderness.  Mild left CVA tenderness. Lymphatic: No lymphadenopathy Neurologic: Grossly intact, no focal deficits Psychiatric: Normal mood and affect  I have reviewed prior pt notes  I have reviewed notes from referring/previous physicians  I have reviewed urinalysis results--microscopic hematuria  I have independently reviewed prior imaging--CT scan images from September 2024 reviewed.  He had left distal ureteral Steinstrasse as well as a fairly large size stone burden in the left lower pole.

## 2024-01-25 ENCOUNTER — Ambulatory Visit (HOSPITAL_BASED_OUTPATIENT_CLINIC_OR_DEPARTMENT_OTHER)
Admission: RE | Admit: 2024-01-25 | Discharge: 2024-01-25 | Disposition: A | Discharge status: Home / Self Care | Source: Ambulatory Visit | Attending: Urology | Admitting: Urology

## 2024-01-25 ENCOUNTER — Ambulatory Visit: Admitting: Urology

## 2024-01-25 ENCOUNTER — Other Ambulatory Visit (HOSPITAL_BASED_OUTPATIENT_CLINIC_OR_DEPARTMENT_OTHER): Payer: Self-pay

## 2024-01-25 VITALS — BP 217/108 | HR 62 | Ht 72.0 in | Wt 255.0 lb

## 2024-01-25 DIAGNOSIS — N5201 Erectile dysfunction due to arterial insufficiency: Secondary | ICD-10-CM

## 2024-01-25 DIAGNOSIS — N201 Calculus of ureter: Secondary | ICD-10-CM | POA: Diagnosis not present

## 2024-01-25 DIAGNOSIS — N200 Calculus of kidney: Secondary | ICD-10-CM | POA: Insufficient documentation

## 2024-01-25 LAB — MICROSCOPIC EXAMINATION: Bacteria, UA: NONE SEEN

## 2024-01-25 LAB — URINALYSIS, ROUTINE W REFLEX MICROSCOPIC
Bilirubin, UA: NEGATIVE
Glucose, UA: NEGATIVE
Ketones, UA: NEGATIVE
Leukocytes,UA: NEGATIVE
Nitrite, UA: NEGATIVE
Protein,UA: NEGATIVE
Specific Gravity, UA: 1.02 (ref 1.005–1.030)
Urobilinogen, Ur: 1 mg/dL (ref 0.2–1.0)
pH, UA: 7 (ref 5.0–7.5)

## 2024-01-25 MED ORDER — SILDENAFIL CITRATE 100 MG PO TABS
ORAL_TABLET | ORAL | 99 refills | Status: AC
Start: 2024-01-25 — End: ?
  Filled 2024-01-25: qty 20, 20d supply, fill #0

## 2024-01-25 MED ORDER — OXYCODONE HCL 5 MG PO TABS
5.0000 mg | ORAL_TABLET | Freq: Four times a day (QID) | ORAL | 0 refills | Status: AC | PRN
Start: 2024-01-25 — End: ?

## 2024-02-16 NOTE — Progress Notes (Incomplete)
 Impression/Assessment:  - Left ureteral stone, progressing.  Pain fairly well-controlled  -Erectile dysfunction.  He would like a prescription for Viagra   Plan:  - Sildenafil  sent in  -He does have high blood pressure today.  He has been off his amlodipine .  He will take his blood pressure at home, let his PCP know as well  -He will continue tamsulosin   -Prescription for oxycodone  sent in  -I will see back in 2 weeks with KUB   History of Present Illness:   7.9.2025: 68 year old male with prior history of urolithiasis, just about a year out from shockwave lithotripsy by Dr. Shona, presents with a 1 day history of left flank pain reminiscent of prior kidney stone pain.  No fever but he has had nausea.  Increased urinary frequency, darker urine.  7.14.2025: Here today for recheck.  Still having intermittent pain.  KUB taken at his last visit was read as having only renal calculi but it looks like his stone was at the L5 transverse process on the left.  KUB today reveals a calcification overlying his lower left sacrum.  He does have ED and would like a prescription for Viagra .  8.6.2025:  Past Medical History:  Diagnosis Date   Complication of anesthesia    heart rate went down to the 40's with colonscopy in 2020, normal heart rate is in the 50's   Hypertension    Kidney stone     Past Surgical History:  Procedure Laterality Date   COLONOSCOPY N/A 12/21/2018   Procedure: COLONOSCOPY;  Surgeon: Shaaron Lamar HERO, MD;  Location: [facility];  Service: Endoscopy;  Laterality: N/A;  8:30   EXTRACORPOREAL SHOCK WAVE LITHOTRIPSY Left 02/09/2023   Procedure: EXTRACORPOREAL SHOCK WAVE LITHOTRIPSY (ESWL);  Surgeon: Shona Layman BROCKS, MD;  Location: [facility];  Service: Urology;  Laterality: Left;   KNEE ARTHROSCOPY Right    yrs ago   left foot surgery     1980's   POLYPECTOMY  12/21/2018   Procedure: POLYPECTOMY;  Surgeon: Shaaron Lamar HERO, MD;  Location: [facility];  Service: Endoscopy;;    Home Medications:  Allergies as of 02/17/2024   No Known Allergies      Medication List        Accurate as of February 16, 2024  9:49 AM. If you have any questions, ask your nurse or doctor.          amLODipine  10 MG tablet Commonly known as: NORVASC  Take 10 mg by mouth daily.   bisoprolol-hydrochlorothiazide 5-6.25 MG tablet Commonly known as: ZIAC Take 1 tablet by mouth daily.   ondansetron  4 MG disintegrating tablet Commonly known as: ZOFRAN -ODT Take 1 tablet (4 mg total) by mouth every 8 (eight) hours as needed for nausea or vomiting.   oxyCODONE  5 MG immediate release tablet Commonly known as: Roxicodone  Take 1 tablet (5 mg total) by mouth every 6 (six) hours as needed for severe pain (pain score 7-10).   sildenafil  100 MG tablet Commonly known as: VIAGRA  Take 1/2 to 1 tablet (50-100 mg) by mouth as needed.   tadalafil  20 MG tablet Commonly known as: CIALIS  Take 1 tablet (20 mg total) by mouth daily as needed for erectile dysfunction.   tamsulosin  0.4 MG Caps capsule Commonly known as: FLOMAX  Take 1 capsule (0.4 mg total) by mouth daily after supper.        Allergies: No Known Allergies  No family history on file.  Social History:  reports that he has  never smoked. He has never used smokeless tobacco. He reports that he does not drink alcohol and does not use drugs.  ROS: A complete review of systems was performed.  All systems are negative except for pertinent findings as noted.  Physical Exam:  Vital signs in last 24 hours: There were no vitals taken for this visit. Constitutional:  Alert and oriented, No acute distress Cardiovascular: Regular rate  Respiratory: Normal respiratory effort GI: Abdomen protuberant, soft.  Minimal left lower quadrant tenderness.  Mild left CVA tenderness. Lymphatic: No lymphadenopathy Neurologic: Grossly intact, no focal deficits Psychiatric: Normal mood and affect  I have reviewed  prior pt notes  I have reviewed notes from referring/previous physicians  I have reviewed urinalysis results--microscopic hematuria  I have independently reviewed prior imaging--CT scan images from September 2024 reviewed.  He had left distal ureteral Steinstrasse as well as a fairly large size stone burden in the left lower pole.

## 2024-02-17 ENCOUNTER — Ambulatory Visit: Admitting: Urology

## 2024-02-17 DIAGNOSIS — N201 Calculus of ureter: Secondary | ICD-10-CM

## 2024-02-17 DIAGNOSIS — N5201 Erectile dysfunction due to arterial insufficiency: Secondary | ICD-10-CM

## 2024-06-21 ENCOUNTER — Encounter (INDEPENDENT_AMBULATORY_CARE_PROVIDER_SITE_OTHER): Payer: Self-pay | Admitting: *Deleted
